# Patient Record
Sex: Female | Born: 1990 | Race: White | Hispanic: No | Marital: Married | Smoking: Never smoker
Health system: Southern US, Community
[De-identification: ages and names within clinical notes are randomized; demographics above are authoritative.]

## PROBLEM LIST (undated history)

## (undated) DIAGNOSIS — S3992XA Unspecified injury of lower back, initial encounter: Secondary | ICD-10-CM

## (undated) DIAGNOSIS — Z789 Other specified health status: Secondary | ICD-10-CM

## (undated) HISTORY — PX: ROOT CANAL: SHX2363

## (undated) HISTORY — PX: WISDOM TOOTH EXTRACTION: SHX21

---

## 2013-05-17 ENCOUNTER — Encounter: Payer: Self-pay | Admitting: Emergency Medicine

## 2013-05-17 ENCOUNTER — Emergency Department
Admission: EM | Admit: 2013-05-17 | Discharge: 2013-05-17 | Disposition: A | Discharge status: Home / Self Care | Payer: 59 | Source: Home / Self Care | Attending: Emergency Medicine | Admitting: Emergency Medicine

## 2013-05-17 DIAGNOSIS — M545 Low back pain, unspecified: Secondary | ICD-10-CM

## 2013-05-17 MED ORDER — CYCLOBENZAPRINE HCL 10 MG PO TABS: 10.0000 mg | ORAL_TABLET | Freq: Three times a day (TID) | ORAL | Status: DC | PRN

## 2013-05-17 MED ORDER — TRAMADOL HCL 50 MG PO TABS: 50.0000 mg | ORAL_TABLET | Freq: Four times a day (QID) | ORAL | Status: DC | PRN
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
## 2013-06-13 ENCOUNTER — Ambulatory Visit (INDEPENDENT_AMBULATORY_CARE_PROVIDER_SITE_OTHER): Payer: 59 | Admitting: Internal Medicine

## 2013-06-13 ENCOUNTER — Encounter: Payer: Self-pay | Admitting: Internal Medicine

## 2013-06-13 VITALS — BP 100/78 | HR 64 | Temp 98.9°F | Ht 68.0 in | Wt 137.0 lb

## 2013-06-13 DIAGNOSIS — Z13220 Encounter for screening for lipoid disorders: Secondary | ICD-10-CM

## 2013-06-13 DIAGNOSIS — N912 Amenorrhea, unspecified: Secondary | ICD-10-CM

## 2013-06-13 DIAGNOSIS — L700 Acne vulgaris: Secondary | ICD-10-CM

## 2013-06-13 DIAGNOSIS — L708 Other acne: Secondary | ICD-10-CM

## 2013-06-13 DIAGNOSIS — Z0000 Encounter for general adult medical examination without abnormal findings: Secondary | ICD-10-CM

## 2013-06-13 LAB — POCT URINALYSIS DIPSTICK
BILIRUBIN UA: NEGATIVE
GLUCOSE UA: NEGATIVE
Ketones, UA: NEGATIVE
LEUKOCYTES UA: NEGATIVE
Nitrite, UA: NEGATIVE
PH UA: 6
Protein, UA: NEGATIVE
RBC UA: NEGATIVE
Spec Grav, UA: 1.025
UROBILINOGEN UA: NEGATIVE

## 2013-06-13 LAB — CBC WITH DIFFERENTIAL/PLATELET
Basophils Absolute: 0.1 10*3/uL (ref 0.0–0.1)
Basophils Relative: 1 % (ref 0–1)
EOS ABS: 0.1 10*3/uL (ref 0.0–0.7)
EOS PCT: 2 % (ref 0–5)
HCT: 39.5 % (ref 36.0–46.0)
Hemoglobin: 13.1 g/dL (ref 12.0–15.0)
Lymphocytes Relative: 16 % (ref 12–46)
Lymphs Abs: 1.1 10*3/uL (ref 0.7–4.0)
MCH: 29.2 pg (ref 26.0–34.0)
MCHC: 33.2 g/dL (ref 30.0–36.0)
MCV: 88 fL (ref 78.0–100.0)
Monocytes Absolute: 1 10*3/uL (ref 0.1–1.0)
Monocytes Relative: 15 % — ABNORMAL HIGH (ref 3–12)
NEUTROS PCT: 66 % (ref 43–77)
Neutro Abs: 4.5 10*3/uL (ref 1.7–7.7)
PLATELETS: 281 10*3/uL (ref 150–400)
RBC: 4.49 MIL/uL (ref 3.87–5.11)
RDW: 13.9 % (ref 11.5–15.5)
WBC: 6.8 10*3/uL (ref 4.0–10.5)

## 2013-06-13 LAB — LIPID PANEL
CHOLESTEROL: 121 mg/dL (ref 0–200)
HDL: 67 mg/dL (ref >39)
LDL Cholesterol: 40 mg/dL (ref 0–99)
Total CHOL/HDL Ratio: 1.8 Ratio
Triglycerides: 69 mg/dL (ref <150)
VLDL: 14 mg/dL (ref 0–40)

## 2013-06-13 LAB — T4, FREE: Free T4: 0.95 ng/dL (ref 0.80–1.80)

## 2013-06-13 LAB — TSH: TSH: 2.051 u[IU]/mL (ref 0.350–4.500)
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
## 2013-06-13 NOTE — Addendum Note (Signed)
Addended by: Judy PimpleEILAND, Jevon Littlepage M on: 06/13/2013 12:38 PM   Modules accepted: Orders

## 2013-06-13 NOTE — Patient Instructions (Signed)
Please give us an updated copy of your immunizations. Return in one year or as needed. Hepatitis a number to do June 2015. You may need a third Gardasil immunization based on your history

## 2013-06-13 NOTE — Addendum Note (Signed)
Addended by: Judy PimpleEILAND, Melford Tullier M on: 06/13/2013 12:35 PM   Modules accepted: Orders

## 2013-11-25 ENCOUNTER — Emergency Department
Admission: EM | Admit: 2013-11-25 | Discharge: 2013-11-25 | Disposition: A | Discharge status: Home / Self Care | Payer: 59 | Source: Home / Self Care | Attending: Family Medicine | Admitting: Family Medicine

## 2013-11-25 ENCOUNTER — Encounter: Payer: Self-pay | Admitting: Emergency Medicine

## 2013-11-25 DIAGNOSIS — S39012A Strain of muscle, fascia and tendon of lower back, initial encounter: Secondary | ICD-10-CM

## 2013-11-25 DIAGNOSIS — S335XXA Sprain of ligaments of lumbar spine, initial encounter: Secondary | ICD-10-CM

## 2013-11-25 HISTORY — DX: Unspecified injury of lower back, initial encounter: S39.92XA

## 2013-11-25 MED ORDER — CYCLOBENZAPRINE HCL 10 MG PO TABS: ORAL_TABLET | ORAL | Status: DC

## 2013-11-25 MED ORDER — MELOXICAM 15 MG PO TABS
15.0000 mg | ORAL_TABLET | Freq: Every day | ORAL | Status: DC
Start: 2013-11-25 — End: 2014-06-15

## 2013-11-25 NOTE — Discharge Instructions (Signed)
Apply ice pack 3 to 4 times daily until pain decreases.  Begin back exercises in about 5 days.

## 2013-11-25 NOTE — ED Notes (Signed)
Reports picking up heavy book bag yesterday and straining muscles in lower right back; pain radiating into buttocks; needs note for missing work tonight; has taken ibuprofen with some relief.
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
## 2014-06-15 ENCOUNTER — Encounter: Payer: Self-pay | Admitting: Internal Medicine

## 2014-06-15 ENCOUNTER — Ambulatory Visit (INDEPENDENT_AMBULATORY_CARE_PROVIDER_SITE_OTHER): Payer: 59 | Admitting: Internal Medicine

## 2014-06-15 ENCOUNTER — Other Ambulatory Visit (HOSPITAL_COMMUNITY)
Admission: RE | Admit: 2014-06-15 | Discharge: 2014-06-15 | Disposition: A | Discharge status: Home / Self Care | Payer: 59 | Source: Ambulatory Visit | Attending: Internal Medicine | Admitting: Internal Medicine

## 2014-06-15 VITALS — BP 98/62 | HR 88 | Temp 98.2°F | Ht 68.0 in | Wt 139.0 lb

## 2014-06-15 DIAGNOSIS — L700 Acne vulgaris: Secondary | ICD-10-CM | POA: Diagnosis not present

## 2014-06-15 DIAGNOSIS — Z130 Encounter for screening for diseases of the blood and blood-forming organs and certain disorders involving the immune mechanism: Secondary | ICD-10-CM

## 2014-06-15 DIAGNOSIS — Z1321 Encounter for screening for nutritional disorder: Secondary | ICD-10-CM | POA: Diagnosis not present

## 2014-06-15 DIAGNOSIS — Z0000 Encounter for general adult medical examination without abnormal findings: Secondary | ICD-10-CM | POA: Diagnosis not present

## 2014-06-15 DIAGNOSIS — L739 Follicular disorder, unspecified: Secondary | ICD-10-CM

## 2014-06-15 DIAGNOSIS — Z13220 Encounter for screening for lipoid disorders: Secondary | ICD-10-CM | POA: Diagnosis not present

## 2014-06-15 DIAGNOSIS — Z1329 Encounter for screening for other suspected endocrine disorder: Secondary | ICD-10-CM | POA: Diagnosis not present

## 2014-06-15 DIAGNOSIS — L731 Pseudofolliculitis barbae: Secondary | ICD-10-CM

## 2014-06-15 DIAGNOSIS — Z01419 Encounter for gynecological examination (general) (routine) without abnormal findings: Secondary | ICD-10-CM | POA: Diagnosis present

## 2014-06-15 LAB — CBC WITH DIFFERENTIAL/PLATELET
BASOS ABS: 0.1 10*3/uL (ref 0.0–0.1)
BASOS PCT: 1 % (ref 0–1)
Eosinophils Absolute: 0.2 10*3/uL (ref 0.0–0.7)
Eosinophils Relative: 3 % (ref 0–5)
HCT: 42.6 % (ref 36.0–46.0)
HEMOGLOBIN: 14 g/dL (ref 12.0–15.0)
LYMPHS PCT: 18 % (ref 12–46)
Lymphs Abs: 1.5 10*3/uL (ref 0.7–4.0)
MCH: 29.3 pg (ref 26.0–34.0)
MCHC: 32.9 g/dL (ref 30.0–36.0)
MCV: 89.1 fL (ref 78.0–100.0)
MPV: 11 fL (ref 8.6–12.4)
Monocytes Absolute: 0.6 10*3/uL (ref 0.1–1.0)
Monocytes Relative: 7 % (ref 3–12)
NEUTROS ABS: 5.8 10*3/uL (ref 1.7–7.7)
Neutrophils Relative %: 71 % (ref 43–77)
PLATELETS: 312 10*3/uL (ref 150–400)
RBC: 4.78 MIL/uL (ref 3.87–5.11)
RDW: 13.9 % (ref 11.5–15.5)
WBC: 8.1 10*3/uL (ref 4.0–10.5)

## 2014-06-15 LAB — LIPID PANEL
Cholesterol: 142 mg/dL (ref 0–200)
HDL: 93 mg/dL (ref >=46)
LDL CALC: 36 mg/dL (ref 0–99)
TRIGLYCERIDES: 64 mg/dL (ref <150)
Total CHOL/HDL Ratio: 1.5 Ratio
VLDL: 13 mg/dL (ref 0–40)

## 2014-06-15 LAB — COMPLETE METABOLIC PANEL WITH GFR
ALK PHOS: 70 U/L (ref 39–117)
ALT: 64 U/L — AB (ref 0–35)
AST: 60 U/L — AB (ref 0–37)
Albumin: 4.7 g/dL (ref 3.5–5.2)
BILIRUBIN TOTAL: 0.7 mg/dL (ref 0.2–1.2)
BUN: 15 mg/dL (ref 6–23)
CALCIUM: 9.7 mg/dL (ref 8.4–10.5)
CO2: 30 mEq/L (ref 19–32)
Chloride: 105 mEq/L (ref 96–112)
Creat: 0.76 mg/dL (ref 0.50–1.10)
Glucose, Bld: 76 mg/dL (ref 70–99)
POTASSIUM: 5 meq/L (ref 3.5–5.3)
Sodium: 143 mEq/L (ref 135–145)
Total Protein: 7.4 g/dL (ref 6.0–8.3)

## 2014-06-15 LAB — POCT URINALYSIS DIPSTICK
Bilirubin, UA: NEGATIVE
Blood, UA: NEGATIVE
GLUCOSE UA: NEGATIVE
KETONES UA: NEGATIVE
Leukocytes, UA: NEGATIVE
NITRITE UA: NEGATIVE
PROTEIN UA: NEGATIVE
Spec Grav, UA: 1.02
UROBILINOGEN UA: NEGATIVE
pH, UA: 6

## 2014-06-15 LAB — TSH: TSH: 1.373 u[IU]/mL (ref 0.350–4.500)

## 2014-06-15 MED ORDER — CLINDAMYCIN PHOSPHATE 1 % EX LOTN: TOPICAL_LOTION | Freq: Two times a day (BID) | CUTANEOUS | Status: DC

## 2014-06-15 NOTE — Progress Notes (Signed)
   Subjective:    Patient ID: Denise Dawson, female    DOB: [birth year], 24 y.o.   MRN: [id number]  HPI 24 year old Female in today for health maintenance exam. Has had issues recently with rash on her genital area after a waxing. Also has developed acne on her back and face.   General health is good. Never been hospitalized overnight. Had an episode where her left foot Witten on in December 2013 for 3-1/2 weeks. She had an MRI of LS-spine that she says was  Normal.  No known drug allergies.  Takes Zyrtec for allergic rhinitis.  Social history: Does not smoke. Social alcohol consumption but not every weekend. She is single and never married. Lives with parents. 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 Moved here from [place] December 2014.  Family history: Father with history of kidney stones      Review of Systems  Skin:       Rash genital area. Acne on back       Objective:   Physical Exam  Constitutional: She is oriented to person, place, and time. She appears well-developed and well-nourished. No distress.  HENT:  Head: Normocephalic and atraumatic.  Right Ear: External ear normal.  Left Ear: External ear normal.  Mouth/Throat: Oropharynx is clear and moist. No oropharyngeal exudate.  Eyes: Conjunctivae and EOM are normal. Pupils are equal, round, and reactive to light. Right eye exhibits no discharge. Left eye exhibits no discharge. No scleral icterus.  Neck: Neck supple. No JVD present. No thyromegaly present.  Cardiovascular: Normal rate, regular rhythm and normal heart sounds.   No murmur heard. Pulmonary/Chest: Effort normal and breath sounds normal. No respiratory distress. She has no wheezes. She has no rales.  Abdominal: Soft. Bowel sounds are normal. She exhibits no distension and no mass. There is no tenderness. There is no rebound and no guarding.  Genitourinary:  Pap done. Bimanual normal.  Musculoskeletal: She exhibits no edema.    Neurological: She is alert and oriented to person, place, and time. She has normal reflexes. No cranial nerve deficit. Coordination normal.  Skin: Skin is warm and dry. No rash noted. She is not diaphoretic.  Folliculitis gentle area from waxing. Acne on back  Psychiatric: She has a normal mood and affect. Her behavior is normal. Judgment and thought content normal.  Vitals reviewed.         Assessment & Plan:  Folliculitis genital area secondary to waxing. Use 1% hydrocortisone cream after waxing  Acne vulgaris use Cleocin T on acne areas at bedtime  Plan:. Patient says that she's had 2 Gardasil immunizations but we do not have those records. She will need to have those sent to us from [place]. 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

## 2014-06-16 LAB — VITAMIN D 25 HYDROXY (VIT D DEFICIENCY, FRACTURES): Vit D, 25-Hydroxy: 33 ng/mL (ref 30–100)

## 2014-06-19 LAB — CYTOLOGY - PAP

## 2014-06-21 ENCOUNTER — Telehealth: Payer: Self-pay | Admitting: *Deleted

## 2014-06-21 MED ORDER — NORETHINDRONE ACET-ETHINYL EST 1-20 MG-MCG PO TABS: 1.0000 | ORAL_TABLET | Freq: Every day | ORAL | Status: DC

## 2014-06-21 NOTE — Telephone Encounter (Signed)
Patient called requesting her birth control pills (Junel )be called in to her pharmacy. [facility]

## 2014-07-06 ENCOUNTER — Encounter: Payer: Self-pay | Admitting: Internal Medicine

## 2014-07-06 DIAGNOSIS — L739 Follicular disorder, unspecified: Secondary | ICD-10-CM | POA: Insufficient documentation

## 2014-07-06 NOTE — Patient Instructions (Signed)
Use Cleocin T on acne areas at bedtime. Use 1% hydrocortisone cream after shaving or waxing.

## 2014-07-11 ENCOUNTER — Other Ambulatory Visit: Payer: Self-pay | Admitting: *Deleted

## 2014-07-11 MED ORDER — NORETHIN ACE-ETH ESTRAD-FE 1.5-30 MG-MCG PO TABS: 1.0000 | ORAL_TABLET | Freq: Every day | ORAL | Status: DC

## 2014-07-11 NOTE — Telephone Encounter (Signed)
Refilled patient Birth Control pills

## 2014-08-08 ENCOUNTER — Ambulatory Visit (INDEPENDENT_AMBULATORY_CARE_PROVIDER_SITE_OTHER): Payer: 59 | Admitting: Internal Medicine

## 2014-08-08 ENCOUNTER — Encounter: Payer: Self-pay | Admitting: Internal Medicine

## 2014-08-08 VITALS — BP 110/70 | HR 68 | Temp 97.6°F | Wt 140.0 lb

## 2014-08-08 DIAGNOSIS — N939 Abnormal uterine and vaginal bleeding, unspecified: Secondary | ICD-10-CM | POA: Diagnosis not present

## 2014-08-08 DIAGNOSIS — N898 Other specified noninflammatory disorders of vagina: Secondary | ICD-10-CM

## 2014-08-08 DIAGNOSIS — T192XXA Foreign body in vulva and vagina, initial encounter: Secondary | ICD-10-CM | POA: Diagnosis not present

## 2014-08-08 DIAGNOSIS — N926 Irregular menstruation, unspecified: Secondary | ICD-10-CM | POA: Diagnosis not present

## 2014-08-08 DIAGNOSIS — W448XXA Other foreign body entering into or through a natural orifice, initial encounter: Secondary | ICD-10-CM

## 2014-08-08 DIAGNOSIS — R350 Frequency of micturition: Secondary | ICD-10-CM | POA: Diagnosis not present

## 2014-08-08 LAB — POCT URINALYSIS DIPSTICK
Bilirubin, UA: NEGATIVE
GLUCOSE UA: NEGATIVE
Ketones, UA: NEGATIVE
LEUKOCYTES UA: NEGATIVE
Nitrite, UA: NEGATIVE
Spec Grav, UA: 1.015
Urobilinogen, UA: NEGATIVE
pH, UA: 6

## 2014-08-08 NOTE — Progress Notes (Signed)
   Subjective:    Patient ID: Denise Dawson, female    DOB: [birth year], 24 y.o.   MRN: [id number]  HPI [facility] complaining of foul-smelling urine. Had abnormal urinalysis. Culture was taken and had no significant growth. She was treated with Macrobid. She is complaining of foul smelling vaginal discharge. Doesn't understand why she has that. Says it's been going on for at least 1-1/2 weeks and she is no better. Visit to [facility] was April 26. Patient recently noticed some vaginal bleeding. She just got back on oral contraceptives. Has had unprotected intercourse recently with someone she's been seeing for a bit.    Review of Systems     Objective:   Physical Exam  There is a foreign object in the vagina that was removed with ring forceps and is a tampon. She has brown foul-smelling blood in the vaginal vault. GC and chlamydia probes taken. Did not do wet prep. Serum pregnancy test done.      Assessment & Plan:  Retained tampon in vagina  Suspect she is having irregular menstrual period and has not been back on oral contraceptives long enough to get stabilized but rule out pregnancy  Check for STD  Plan: Serum hCG drawn. GC and chlamydia probes taken. Is to continue with oral contraceptives and call if abnormal vaginal bleeding persist. Use a vinegar and water douche once.

## 2014-08-08 NOTE — Patient Instructions (Signed)
Tampon removed. GC and chlamydia probes taken because of unprotected intercourse. Serum pregnancy test done. Use vinegar and water douche. Call if symptoms do not improve

## 2014-08-09 LAB — GC/CHLAMYDIA PROBE AMP
CT Probe RNA: NEGATIVE
GC Probe RNA: NEGATIVE

## 2014-08-09 LAB — HCG, SERUM, QUALITATIVE: Preg, Serum: NEGATIVE

## 2014-08-10 ENCOUNTER — Telehealth: Payer: Self-pay | Admitting: *Deleted

## 2014-08-10 NOTE — Telephone Encounter (Signed)
Left message regarding lab results on patient voice mail.

## 2015-03-06 ENCOUNTER — Encounter: Payer: Self-pay | Admitting: Internal Medicine

## 2015-03-06 ENCOUNTER — Ambulatory Visit (INDEPENDENT_AMBULATORY_CARE_PROVIDER_SITE_OTHER): Payer: 59 | Admitting: Internal Medicine

## 2015-03-06 VITALS — BP 114/76 | HR 74 | Temp 98.3°F | Resp 20 | Ht 68.0 in | Wt 149.0 lb

## 2015-03-06 DIAGNOSIS — R1032 Left lower quadrant pain: Secondary | ICD-10-CM

## 2015-03-06 DIAGNOSIS — Z7251 High risk heterosexual behavior: Secondary | ICD-10-CM

## 2015-03-06 LAB — CBC WITH DIFFERENTIAL/PLATELET
Basophils Absolute: 0.1 10*3/uL (ref 0.0–0.1)
Basophils Relative: 1 % (ref 0–1)
EOS ABS: 0.3 10*3/uL (ref 0.0–0.7)
Eosinophils Relative: 3 % (ref 0–5)
HCT: 40.2 % (ref 36.0–46.0)
Hemoglobin: 13.8 g/dL (ref 12.0–15.0)
LYMPHS PCT: 20 % (ref 12–46)
Lymphs Abs: 1.8 10*3/uL (ref 0.7–4.0)
MCH: 30.6 pg (ref 26.0–34.0)
MCHC: 34.3 g/dL (ref 30.0–36.0)
MCV: 89.1 fL (ref 78.0–100.0)
MONO ABS: 0.7 10*3/uL (ref 0.1–1.0)
MONOS PCT: 8 % (ref 3–12)
MPV: 11.5 fL (ref 8.6–12.4)
Neutro Abs: 6.2 10*3/uL (ref 1.7–7.7)
Neutrophils Relative %: 68 % (ref 43–77)
PLATELETS: 336 10*3/uL (ref 150–400)
RBC: 4.51 MIL/uL (ref 3.87–5.11)
RDW: 13.2 % (ref 11.5–15.5)
WBC: 9.1 10*3/uL (ref 4.0–10.5)

## 2015-03-06 LAB — POCT URINALYSIS DIPSTICK
BILIRUBIN UA: NEGATIVE
GLUCOSE UA: NEGATIVE
KETONES UA: NEGATIVE
Leukocytes, UA: NEGATIVE
NITRITE UA: NEGATIVE
PH UA: 7
Protein, UA: NEGATIVE
RBC UA: NEGATIVE
SPEC GRAV UA: 1.01
Urobilinogen, UA: 0.2

## 2015-03-07 LAB — HIV ANTIBODY (ROUTINE TESTING W REFLEX): HIV: NONREACTIVE

## 2015-03-07 LAB — HCG, QUANTITATIVE, PREGNANCY: hCG, Beta Chain, Quant, S: <2 m[IU]/mL

## 2015-03-07 LAB — GC/CHLAMYDIA PROBE AMP
CT PROBE, AMP APTIMA: NEGATIVE
GC PROBE AMP APTIMA: NEGATIVE

## 2015-03-07 LAB — HEPATITIS C ANTIBODY: HCV Ab: NEGATIVE

## 2015-03-07 NOTE — Progress Notes (Signed)
   Subjective:    Patient ID: Denise Dawson, female    DOB: 1990-10-02, 24 y.o.   MRN: [id number]  HPI  Patient has been having left lower quadrant abdominal "pressure " for the past couple of weeks. No dysuria. No back pain. No history of heavy lifting or exertion.  No vaginal discharge. Patient has been sexually active without use of protection recently. She  has been prescribed  Oral contraceptives in the past but currently is not taking oral contraceptives. Did not use condoms with sexual intercourse recently. Has some concerns about a possible retained tampon since she had this happen one other occasion. No vaginal odor that she has noticed. Says she took a home pregnancy test which was negative.    Review of Systems     Objective:   Physical Exam  Abdomen is soft nondistended without hepatosplenomegaly 4 masses. Very mildly tender in left lower quadrant without rebound tenderness and not much tenderness in all. Urinalysis is normal. She has a thick white vaginal discharge. GC and chlamydia probes taken.       Assessment & Plan:   Left lower quadrant abdominal pain consider PID, ectopic pregnancy, musculoskeletal pain, kidney stone. Urinalysis is normal and shows no evidence of hematuria or infection. GC and chlamydia probes are pending. She'll have ultrasound of left abdomen to see if she has a left ovarian cyst or possible tubo-ovarian abscess. CBC drawn. Serum hCG drawn. Hepatitis C antibody and HIV drawn.   25 minutes spent with patient.

## 2015-03-07 NOTE — Patient Instructions (Signed)
Lab work is pending. GC and chlamydia probes pending. Serum pregnancy test pending. Have ultrasound of left lower abdomen to look for ovarian cyst.

## 2015-03-14 ENCOUNTER — Other Ambulatory Visit: Payer: Self-pay | Admitting: Internal Medicine

## 2015-03-14 ENCOUNTER — Telehealth: Payer: Self-pay

## 2015-03-14 DIAGNOSIS — R1032 Left lower quadrant pain: Secondary | ICD-10-CM

## 2015-03-15 NOTE — Telephone Encounter (Signed)
Order for US was incorrect new orders placed for u/s abd and pelvis

## 2015-03-19 ENCOUNTER — Ambulatory Visit
Admission: RE | Admit: 2015-03-19 | Discharge: 2015-03-19 | Disposition: A | Discharge status: Home / Self Care | Payer: 59 | Source: Ambulatory Visit | Attending: Internal Medicine | Admitting: Internal Medicine

## 2015-03-19 DIAGNOSIS — R1032 Left lower quadrant pain: Secondary | ICD-10-CM

## 2015-04-16 ENCOUNTER — Encounter: Payer: Self-pay | Admitting: Emergency Medicine

## 2015-04-16 ENCOUNTER — Emergency Department
Admission: EM | Admit: 2015-04-16 | Discharge: 2015-04-16 | Disposition: A | Discharge status: Home / Self Care | Payer: 59 | Source: Home / Self Care | Attending: Family Medicine | Admitting: Family Medicine

## 2015-04-16 DIAGNOSIS — N390 Urinary tract infection, site not specified: Secondary | ICD-10-CM

## 2015-04-16 LAB — POCT URINALYSIS DIP (MANUAL ENTRY)
Bilirubin, UA: NEGATIVE
Blood, UA: NEGATIVE
Glucose, UA: NEGATIVE
Ketones, POC UA: NEGATIVE
Nitrite, UA: NEGATIVE
Protein Ur, POC: NEGATIVE
Spec Grav, UA: 1.015 (ref 1.005–1.03)
Urobilinogen, UA: 0.2 (ref 0–1)
pH, UA: 7 (ref 5–8)

## 2015-04-16 MED ORDER — CEPHALEXIN 500 MG PO CAPS: 500.0000 mg | ORAL_CAPSULE | Freq: Two times a day (BID) | ORAL | Status: DC

## 2015-04-16 NOTE — Discharge Instructions (Signed)
You may try an over the counter medication called phenazopyridine (e.g. Azo, Baridium or pyridium) for urinary discomfort and bladder spasms.  The medication does turn your urine orange, this is normal.  Do not take more than is indicated on the packaging.    Please take antibiotics as prescribed and be sure to complete entire course even if you start to feel better to ensure infection does not come back.
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
## 2015-04-16 NOTE — ED Notes (Signed)
Patient reports 6 days of dysuria; used OTC/AZO until last evening for comfort.

## 2015-04-18 ENCOUNTER — Telehealth: Payer: Self-pay | Admitting: *Deleted

## 2015-04-18 LAB — URINE CULTURE
Colony Count: NO GROWTH
Organism ID, Bacteria: NO GROWTH

## 2019-01-12 LAB — OB RESULTS CONSOLE ABO/RH: RH Type: POSITIVE

## 2019-01-12 LAB — OB RESULTS CONSOLE GC/CHLAMYDIA
Chlamydia: NEGATIVE
Gonorrhea: NEGATIVE

## 2019-01-12 LAB — OB RESULTS CONSOLE ANTIBODY SCREEN: Antibody Screen: NEGATIVE

## 2019-01-12 LAB — OB RESULTS CONSOLE HEPATITIS B SURFACE ANTIGEN: Hepatitis B Surface Ag: NEGATIVE

## 2019-01-12 LAB — OB RESULTS CONSOLE RPR: RPR: NONREACTIVE

## 2019-01-12 LAB — OB RESULTS CONSOLE HIV ANTIBODY (ROUTINE TESTING): HIV: NONREACTIVE

## 2019-01-12 LAB — OB RESULTS CONSOLE RUBELLA ANTIBODY, IGM: Rubella: IMMUNE

## 2019-07-06 ENCOUNTER — Encounter (HOSPITAL_COMMUNITY): Payer: Self-pay

## 2019-07-08 ENCOUNTER — Encounter (HOSPITAL_COMMUNITY): Payer: Self-pay

## 2019-07-08 NOTE — Patient Instructions (Addendum)
Tarrie Mcmichen  07/08/2019   Your procedure is scheduled on:  07/18/2019  Arrive at 1030 at Entrance C on [employer] at [facility]  and [employer]. You are invited to use the FREE valet parking or use the Visitor's parking deck.  Pick up the phone at the desk and dial [phone].  Call this number if you have problems the morning of surgery: [phone]  Remember:   Do not eat food:(After Midnight) Desps de medianoche.  Do not drink clear liquids: (After Midnight) Desps de medianoche.  Take these medicines the morning of surgery with A SIP OF WATER:  none   Do not wear jewelry, make-up or nail polish.  Do not wear lotions, powders, or perfumes. Do not wear deodorant.  Do not shave 48 hours prior to surgery.  Do not bring valuables to the hospital.  [facility] is not   responsible for any belongings or valuables brought to the hospital.  Contacts, dentures or bridgework may not be worn into surgery.  Leave suitcase in the car. After surgery it may be brought to your room.  For patients admitted to the hospital, checkout time is 11:00 AM the day of              discharge.      Please read over the following fact sheets that you were given:     Preparing for Surgery

## 2019-07-09 ENCOUNTER — Inpatient Hospital Stay (HOSPITAL_COMMUNITY)
Admission: AD | Admit: 2019-07-09 | Discharge: 2019-07-12 | DRG: 788 | Disposition: A | Discharge status: Home / Self Care | Payer: Commercial Managed Care - PPO | Attending: Obstetrics | Admitting: Obstetrics

## 2019-07-09 ENCOUNTER — Encounter (HOSPITAL_COMMUNITY): Payer: Self-pay | Admitting: Obstetrics and Gynecology

## 2019-07-09 ENCOUNTER — Other Ambulatory Visit: Payer: Self-pay

## 2019-07-09 DIAGNOSIS — Z880 Allergy status to penicillin: Secondary | ICD-10-CM

## 2019-07-09 DIAGNOSIS — O42013 Preterm premature rupture of membranes, onset of labor within 24 hours of rupture, third trimester: Secondary | ICD-10-CM | POA: Diagnosis not present

## 2019-07-09 DIAGNOSIS — Z20822 Contact with and (suspected) exposure to covid-19: Secondary | ICD-10-CM | POA: Diagnosis present

## 2019-07-09 DIAGNOSIS — O321XX1 Maternal care for breech presentation, fetus 1: Secondary | ICD-10-CM | POA: Diagnosis present

## 2019-07-09 DIAGNOSIS — Z3A38 38 weeks gestation of pregnancy: Secondary | ICD-10-CM | POA: Diagnosis not present

## 2019-07-09 DIAGNOSIS — O321XX0 Maternal care for breech presentation, not applicable or unspecified: Principal | ICD-10-CM | POA: Diagnosis present

## 2019-07-09 LAB — CBC
HCT: 43.5 % (ref 36.0–46.0)
Hemoglobin: 14.1 g/dL (ref 12.0–15.0)
MCH: 29.9 pg (ref 26.0–34.0)
MCHC: 32.4 g/dL (ref 30.0–36.0)
MCV: 92.4 fL (ref 80.0–100.0)
Platelets: 221 10*3/uL (ref 150–400)
RBC: 4.71 MIL/uL (ref 3.87–5.11)
RDW: 13 % (ref 11.5–15.5)
WBC: 17.2 10*3/uL — ABNORMAL HIGH (ref 4.0–10.5)
nRBC: 0 % (ref 0.0–0.2)

## 2019-07-09 LAB — RESPIRATORY PANEL BY RT PCR (FLU A&B, COVID)
Influenza A by PCR: NEGATIVE
Influenza B by PCR: NEGATIVE
SARS Coronavirus 2 by RT PCR: NEGATIVE

## 2019-07-09 LAB — TYPE AND SCREEN
ABO/RH(D): O POS
Antibody Screen: NEGATIVE

## 2019-07-09 LAB — POCT FERN TEST: POCT Fern Test: POSITIVE — AB

## 2019-07-09 MED ORDER — PROMETHAZINE HCL 25 MG/ML IJ SOLN
25.0000 mg | Freq: Once | INTRAMUSCULAR | Status: AC
  Administered 2019-07-09: 25 mg via INTRAVENOUS
  Filled 2019-07-09: qty 1

## 2019-07-09 MED ORDER — FAMOTIDINE IN NACL 20-0.9 MG/50ML-% IV SOLN
INTRAVENOUS | Status: AC
  Filled 2019-07-09: qty 50

## 2019-07-09 MED ORDER — FAMOTIDINE IN NACL 20-0.9 MG/50ML-% IV SOLN
20.0000 mg | Freq: Once | INTRAVENOUS | Status: AC
  Administered 2019-07-09: 20 mg via INTRAVENOUS
  Filled 2019-07-09: qty 50

## 2019-07-09 MED ORDER — CALCIUM CARBONATE ANTACID 500 MG PO CHEW: 2.0000 | CHEWABLE_TABLET | ORAL | Status: DC | PRN

## 2019-07-09 MED ORDER — PRENATAL MULTIVITAMIN CH: 1.0000 | ORAL_TABLET | Freq: Every day | ORAL | Status: DC

## 2019-07-09 MED ORDER — ZOLPIDEM TARTRATE 5 MG PO TABS: 5.0000 mg | ORAL_TABLET | Freq: Every evening | ORAL | Status: DC | PRN

## 2019-07-09 MED ORDER — DOCUSATE SODIUM 100 MG PO CAPS: 100.0000 mg | ORAL_CAPSULE | Freq: Every day | ORAL | Status: DC

## 2019-07-09 MED ORDER — ACETAMINOPHEN 325 MG PO TABS: 650.0000 mg | ORAL_TABLET | ORAL | Status: DC | PRN

## 2019-07-09 MED ORDER — CEFAZOLIN SODIUM-DEXTROSE 2-4 GM/100ML-% IV SOLN
2.0000 g | INTRAVENOUS | Status: AC
  Administered 2019-07-10: 2 g via INTRAVENOUS
  Filled 2019-07-09: qty 100

## 2019-07-09 NOTE — H&P (Signed)
29 y.o.  G1P0 [redacted]w[redacted]d comes in ROM with breech for cesarean section at term.  Patient has good fetal movement and no bleeding. ROM about 5 pm, clear.  Past Medical History:  Diagnosis Date  . Medical history non-contributory     Past Surgical History:  Procedure Laterality Date  . WISDOM TOOTH EXTRACTION      OB History  Gravida Para Term Preterm AB Living  1            SAB TAB Ectopic Multiple Live Births               # Outcome Date GA Lbr Len/2nd Weight Sex Delivery Anes PTL Lv  1 Current             Social History   Socioeconomic History  . Marital status: Unknown    Spouse name: Not on file  . Number of children: Not on file  . Years of education: Not on file  . Highest education level: Not on file  Occupational History  . Not on file  Tobacco Use  . Smoking status: Never Smoker  . Smokeless tobacco: Never Used  Substance and Sexual Activity  . Alcohol use: Not Currently  . Drug use: Never  . Sexual activity: Not on file  Other Topics Concern  . Not on file  Social History Narrative  . Not on file   Social Determinants of Health   Financial Resource Strain:   . Difficulty of Paying Living Expenses:   Food Insecurity:   . Worried About Programme researcher, broadcasting/film/video in the Last Year:   . Barista in the Last Year:   Transportation Needs:   . Freight forwarder (Medical):   Marland Kitchen Lack of Transportation (Non-Medical):   Physical Activity:   . Days of Exercise per Week:   . Minutes of Exercise per Session:   Stress:   . Feeling of Stress :   Social Connections:   . Frequency of Communication with Friends and Family:   . Frequency of Social Gatherings with Friends and Family:   . Attends Religious Services:   . Active Member of Clubs or Organizations:   . Attends Banker Meetings:   Marland Kitchen Marital Status:   Intimate Partner Violence:   . Fear of Current or Ex-Partner:   . Emotionally Abused:   Marland Kitchen Physically Abused:   . Sexually Abused:     Penicillins   Prenatal Course: Breech presentation confirmed in MAU.   Prenatal Transfer Tool  Maternal Diabetes: No Genetic Screening: Normal Maternal Ultrasounds/Referrals: Normal; low fluid over last week. Fetal Ultrasounds or other Referrals:  None Maternal Substance Abuse:  No Significant Maternal Medications:  None Significant Maternal Lab Results: Group B Strep negative  Vitals:   07/09/19 1914  BP: 123/84  Pulse: 94  Resp: 17  Temp: 98.8 F (37.1 C)  Weight: 81.8 kg    Lungs/Cor:  NAD Abdomen:  soft, gravid Ex:  no cords, erythema SVE:  NA FHTs:  140s, gstv, NSTR, cat 1  A/P   For cesarean sectionat term at 0600 (pt cannot go before 2 am unless labor or fetal issues).  All risks, benefits and alternatives discussed with patient and she desires to proceed.  PCN allergy is nausea only.  Fetal monitoring for now.  Clears until 10 pm.  GBS neg.  Loney Laurence

## 2019-07-09 NOTE — MAU Note (Signed)
States she noticed she was leaking clear fluid since being at [employer] earlier today.  Denies VB.  No contractions. Reports her baby is breech and has oligo so wanted to come get checked.  Endorses + FM.

## 2019-07-09 NOTE — MAU Provider Note (Signed)
First Provider Initiated Contact with Patient 07/09/19 1950     S: Ms. Denise Dawson is a 29 y.o. G1P0 at [redacted]w[redacted]d  who presents to MAU today complaining of leaking of fluid since early this evening. She denies vaginal bleeding. She denies contractions. She reports normal fetal movement.    Patient states she has been persistently breech in clinic and is planning a primary cesarean. Patient most recently ate a [employer] hotdog at 1800 hours.  She receives care with [facility].  O: BP 123/84    Pulse 94    Temp 98.8 F (37.1 C)    Resp 17    Wt 81.8 kg    LMP 10/15/2018    BMI 27.43 kg/m  GENERAL: Well-developed, well-nourished female in no acute distress.  HEAD: Normocephalic, atraumatic.  CHEST: Normal effort of breathing, regular heart rate ABDOMEN: Soft, nontender, gravid PELVIC: Normal external female genitalia. Vagina is pink and rugated. Cervix with normal contour, no lesions. Normal discharge.  Positive pooling. Grossly ruptured with passage of large volumes of fluid on sterile speculum insertion  Cervical exam: Deferred due to frank breech position    Fetal Monitoring: Baseline: 135 Variability: Mod Accelerations: Pos 15 x 15 Decelerations: N/A Contractions: Occasional  A: SIUP at [redacted]w[redacted]d  Grossly ruptured Fern positive Confirmed frank breech consistent with previous scan  P: Prep for OR  Clayton Bibles, CNM 07/09/2019 9:38 PM

## 2019-07-10 ENCOUNTER — Encounter (HOSPITAL_COMMUNITY)
Admission: AD | Disposition: A | Discharge status: Home / Self Care | Payer: Self-pay | Source: Home / Self Care | Attending: Obstetrics

## 2019-07-10 ENCOUNTER — Encounter (HOSPITAL_COMMUNITY): Payer: Self-pay | Admitting: Obstetrics and Gynecology

## 2019-07-10 ENCOUNTER — Inpatient Hospital Stay (HOSPITAL_COMMUNITY): Payer: Commercial Managed Care - PPO | Admitting: Anesthesiology

## 2019-07-10 DIAGNOSIS — O321XX0 Maternal care for breech presentation, not applicable or unspecified: Secondary | ICD-10-CM | POA: Diagnosis present

## 2019-07-10 LAB — RPR: RPR Ser Ql: NONREACTIVE

## 2019-07-10 LAB — ABO/RH: ABO/RH(D): O POS

## 2019-07-10 SURGERY — Surgical Case
Anesthesia: Spinal

## 2019-07-10 MED ORDER — OXYTOCIN 40 UNITS IN NORMAL SALINE INFUSION - SIMPLE MED
INTRAVENOUS | Status: AC
  Filled 2019-07-10: qty 1000

## 2019-07-10 MED ORDER — PHENYLEPHRINE HCL (PRESSORS) 10 MG/ML IV SOLN
INTRAVENOUS | Status: DC | PRN
  Administered 2019-07-10 (×2): 80 ug via INTRAVENOUS

## 2019-07-10 MED ORDER — KETOROLAC TROMETHAMINE 30 MG/ML IJ SOLN: 30.0000 mg | Freq: Once | INTRAMUSCULAR | Status: DC | PRN

## 2019-07-10 MED ORDER — MEASLES, MUMPS & RUBELLA VAC IJ SOLR: 0.5000 mL | Freq: Once | INTRAMUSCULAR | Status: DC

## 2019-07-10 MED ORDER — SCOPOLAMINE 1 MG/3DAYS TD PT72
MEDICATED_PATCH | TRANSDERMAL | Status: AC
  Filled 2019-07-10: qty 1

## 2019-07-10 MED ORDER — NALOXONE HCL 0.4 MG/ML IJ SOLN: 0.4000 mg | INTRAMUSCULAR | Status: DC | PRN

## 2019-07-10 MED ORDER — LACTATED RINGERS IV SOLN: INTRAVENOUS | Status: DC

## 2019-07-10 MED ORDER — FERROUS SULFATE 325 (65 FE) MG PO TABS
325.0000 mg | ORAL_TABLET | Freq: Two times a day (BID) | ORAL | Status: DC
  Administered 2019-07-10 – 2019-07-12 (×4): 325 mg via ORAL
  Filled 2019-07-10 (×4): qty 1

## 2019-07-10 MED ORDER — METHYLERGONOVINE MALEATE 0.2 MG/ML IJ SOLN: 0.2000 mg | INTRAMUSCULAR | Status: DC | PRN

## 2019-07-10 MED ORDER — ACETAMINOPHEN 10 MG/ML IV SOLN: 1000.0000 mg | Freq: Once | INTRAVENOUS | Status: DC | PRN

## 2019-07-10 MED ORDER — SIMETHICONE 80 MG PO CHEW: 80.0000 mg | CHEWABLE_TABLET | ORAL | Status: DC | PRN

## 2019-07-10 MED ORDER — FLEET ENEMA 7-19 GM/118ML RE ENEM: 1.0000 | ENEMA | Freq: Every day | RECTAL | Status: DC | PRN

## 2019-07-10 MED ORDER — DIPHENHYDRAMINE HCL 25 MG PO CAPS: 25.0000 mg | ORAL_CAPSULE | ORAL | Status: DC | PRN

## 2019-07-10 MED ORDER — ONDANSETRON HCL 4 MG/2ML IJ SOLN
INTRAMUSCULAR | Status: AC
  Filled 2019-07-10: qty 2

## 2019-07-10 MED ORDER — ZOLPIDEM TARTRATE 5 MG PO TABS: 5.0000 mg | ORAL_TABLET | Freq: Every evening | ORAL | Status: DC | PRN

## 2019-07-10 MED ORDER — KETOROLAC TROMETHAMINE 30 MG/ML IJ SOLN: 30.0000 mg | Freq: Four times a day (QID) | INTRAMUSCULAR | Status: AC | PRN

## 2019-07-10 MED ORDER — SODIUM CHLORIDE 0.9 % IV SOLN: INTRAVENOUS | Status: DC | PRN

## 2019-07-10 MED ORDER — SOD CITRATE-CITRIC ACID 500-334 MG/5ML PO SOLN
ORAL | Status: AC
  Administered 2019-07-10: 30 mL
  Filled 2019-07-10: qty 30

## 2019-07-10 MED ORDER — KETOROLAC TROMETHAMINE 30 MG/ML IJ SOLN: 30.0000 mg | Freq: Once | INTRAMUSCULAR | Status: DC

## 2019-07-10 MED ORDER — DIPHENHYDRAMINE HCL 25 MG PO CAPS: 25.0000 mg | ORAL_CAPSULE | Freq: Four times a day (QID) | ORAL | Status: DC | PRN

## 2019-07-10 MED ORDER — FENTANYL CITRATE (PF) 100 MCG/2ML IJ SOLN
INTRAMUSCULAR | Status: AC
  Filled 2019-07-10: qty 2

## 2019-07-10 MED ORDER — NALOXONE HCL 4 MG/10ML IJ SOLN
1.0000 ug/kg/h | INTRAVENOUS | Status: DC | PRN
  Filled 2019-07-10: qty 5

## 2019-07-10 MED ORDER — ACETAMINOPHEN 500 MG PO TABS: 1000.0000 mg | ORAL_TABLET | Freq: Four times a day (QID) | ORAL | Status: AC

## 2019-07-10 MED ORDER — DIBUCAINE (PERIANAL) 1 % EX OINT: 1.0000 "application " | TOPICAL_OINTMENT | CUTANEOUS | Status: DC | PRN

## 2019-07-10 MED ORDER — KETOROLAC TROMETHAMINE 30 MG/ML IJ SOLN
30.0000 mg | Freq: Four times a day (QID) | INTRAMUSCULAR | Status: AC | PRN
  Administered 2019-07-10 (×2): 30 mg via INTRAVENOUS
  Filled 2019-07-10 (×2): qty 1

## 2019-07-10 MED ORDER — NALBUPHINE HCL 10 MG/ML IJ SOLN: 5.0000 mg | Freq: Once | INTRAMUSCULAR | Status: DC | PRN

## 2019-07-10 MED ORDER — MENTHOL 3 MG MT LOZG
1.0000 | LOZENGE | OROMUCOSAL | Status: DC | PRN
  Administered 2019-07-11: 3 mg via ORAL
  Filled 2019-07-10: qty 9

## 2019-07-10 MED ORDER — LACTATED RINGERS IV SOLN: INTRAVENOUS | Status: DC | PRN

## 2019-07-10 MED ORDER — PHENYLEPHRINE HCL-NACL 20-0.9 MG/250ML-% IV SOLN
INTRAVENOUS | Status: DC | PRN
  Administered 2019-07-10: 60 ug/min via INTRAVENOUS

## 2019-07-10 MED ORDER — PHENYLEPHRINE HCL-NACL 20-0.9 MG/250ML-% IV SOLN
INTRAVENOUS | Status: AC
  Filled 2019-07-10: qty 250

## 2019-07-10 MED ORDER — SENNOSIDES-DOCUSATE SODIUM 8.6-50 MG PO TABS
2.0000 | ORAL_TABLET | ORAL | Status: DC
  Administered 2019-07-11 – 2019-07-12 (×2): 2 via ORAL
  Filled 2019-07-10 (×2): qty 2

## 2019-07-10 MED ORDER — DIPHENHYDRAMINE HCL 50 MG/ML IJ SOLN: 12.5000 mg | INTRAMUSCULAR | Status: DC | PRN

## 2019-07-10 MED ORDER — OXYTOCIN 10 UNIT/ML IJ SOLN
INTRAMUSCULAR | Status: DC | PRN
  Administered 2019-07-10: 40 [IU] via INTRAMUSCULAR

## 2019-07-10 MED ORDER — SIMETHICONE 80 MG PO CHEW
80.0000 mg | CHEWABLE_TABLET | ORAL | Status: DC
  Administered 2019-07-11 – 2019-07-12 (×2): 80 mg via ORAL
  Filled 2019-07-10 (×2): qty 1

## 2019-07-10 MED ORDER — PHENYLEPHRINE 40 MCG/ML (10ML) SYRINGE FOR IV PUSH (FOR BLOOD PRESSURE SUPPORT)
PREFILLED_SYRINGE | INTRAVENOUS | Status: AC
  Filled 2019-07-10: qty 10

## 2019-07-10 MED ORDER — OXYTOCIN 40 UNITS IN NORMAL SALINE INFUSION - SIMPLE MED: 2.5000 [IU]/h | INTRAVENOUS | Status: AC

## 2019-07-10 MED ORDER — IBUPROFEN 600 MG PO TABS: 600.0000 mg | ORAL_TABLET | Freq: Four times a day (QID) | ORAL | Status: DC | PRN

## 2019-07-10 MED ORDER — METHYLERGONOVINE MALEATE 0.2 MG PO TABS: 0.2000 mg | ORAL_TABLET | ORAL | Status: DC | PRN

## 2019-07-10 MED ORDER — IBUPROFEN 800 MG PO TABS
800.0000 mg | ORAL_TABLET | Freq: Three times a day (TID) | ORAL | Status: DC
  Administered 2019-07-11 – 2019-07-12 (×6): 800 mg via ORAL
  Filled 2019-07-10 (×6): qty 1

## 2019-07-10 MED ORDER — TETANUS-DIPHTH-ACELL PERTUSSIS 5-2.5-18.5 LF-MCG/0.5 IM SUSP: 0.5000 mL | Freq: Once | INTRAMUSCULAR | Status: DC

## 2019-07-10 MED ORDER — FENTANYL CITRATE (PF) 100 MCG/2ML IJ SOLN: 25.0000 ug | INTRAMUSCULAR | Status: DC | PRN

## 2019-07-10 MED ORDER — WITCH HAZEL-GLYCERIN EX PADS: 1.0000 "application " | MEDICATED_PAD | CUTANEOUS | Status: DC | PRN

## 2019-07-10 MED ORDER — ACETAMINOPHEN 10 MG/ML IV SOLN
INTRAVENOUS | Status: AC
  Filled 2019-07-10: qty 100

## 2019-07-10 MED ORDER — MORPHINE SULFATE (PF) 0.5 MG/ML IJ SOLN
INTRAMUSCULAR | Status: DC | PRN
  Administered 2019-07-10: .15 mg via INTRATHECAL

## 2019-07-10 MED ORDER — OXYCODONE-ACETAMINOPHEN 5-325 MG PO TABS
1.0000 | ORAL_TABLET | ORAL | Status: DC | PRN
  Administered 2019-07-10: 1 via ORAL
  Administered 2019-07-10: 2 via ORAL
  Administered 2019-07-11 (×2): 1 via ORAL
  Filled 2019-07-10 (×2): qty 1
  Filled 2019-07-10: qty 2
  Filled 2019-07-10: qty 1

## 2019-07-10 MED ORDER — STERILE WATER FOR IRRIGATION IR SOLN
Status: DC | PRN
  Administered 2019-07-10: 1000 mL

## 2019-07-10 MED ORDER — NALBUPHINE HCL 10 MG/ML IJ SOLN: 5.0000 mg | INTRAMUSCULAR | Status: DC | PRN

## 2019-07-10 MED ORDER — PRENATAL MULTIVITAMIN CH
1.0000 | ORAL_TABLET | Freq: Every day | ORAL | Status: DC
  Administered 2019-07-10 – 2019-07-12 (×3): 1 via ORAL
  Filled 2019-07-10 (×3): qty 1

## 2019-07-10 MED ORDER — FENTANYL CITRATE (PF) 100 MCG/2ML IJ SOLN
INTRAMUSCULAR | Status: DC | PRN
  Administered 2019-07-10: 15 ug via INTRATHECAL

## 2019-07-10 MED ORDER — SODIUM CHLORIDE 0.9% FLUSH: 3.0000 mL | INTRAVENOUS | Status: DC | PRN

## 2019-07-10 MED ORDER — SIMETHICONE 80 MG PO CHEW
80.0000 mg | CHEWABLE_TABLET | Freq: Three times a day (TID) | ORAL | Status: DC
  Administered 2019-07-10 – 2019-07-12 (×7): 80 mg via ORAL
  Filled 2019-07-10 (×7): qty 1

## 2019-07-10 MED ORDER — METOCLOPRAMIDE HCL 5 MG/ML IJ SOLN
INTRAMUSCULAR | Status: DC | PRN
  Administered 2019-07-10: 10 mg via INTRAVENOUS

## 2019-07-10 MED ORDER — ONDANSETRON HCL 4 MG/2ML IJ SOLN
INTRAMUSCULAR | Status: DC | PRN
  Administered 2019-07-10: 4 mg via INTRAVENOUS

## 2019-07-10 MED ORDER — SCOPOLAMINE 1 MG/3DAYS TD PT72
1.0000 | MEDICATED_PATCH | Freq: Once | TRANSDERMAL | Status: DC
  Administered 2019-07-10: 1.5 mg via TRANSDERMAL

## 2019-07-10 MED ORDER — BUPIVACAINE IN DEXTROSE 0.75-8.25 % IT SOLN
INTRATHECAL | Status: DC | PRN
  Administered 2019-07-10: 1.6 mL via INTRATHECAL

## 2019-07-10 MED ORDER — DEXAMETHASONE SODIUM PHOSPHATE 4 MG/ML IJ SOLN
INTRAMUSCULAR | Status: AC
  Filled 2019-07-10: qty 1

## 2019-07-10 MED ORDER — BISACODYL 10 MG RE SUPP: 10.0000 mg | Freq: Every day | RECTAL | Status: DC | PRN

## 2019-07-10 MED ORDER — MORPHINE SULFATE (PF) 0.5 MG/ML IJ SOLN
INTRAMUSCULAR | Status: AC
  Filled 2019-07-10: qty 10

## 2019-07-10 MED ORDER — ONDANSETRON HCL 4 MG/2ML IJ SOLN: 4.0000 mg | Freq: Three times a day (TID) | INTRAMUSCULAR | Status: DC | PRN

## 2019-07-10 MED ORDER — COCONUT OIL OIL
1.0000 "application " | TOPICAL_OIL | Status: DC | PRN
  Administered 2019-07-11: 1 via TOPICAL

## 2019-07-10 MED ORDER — SODIUM CHLORIDE 0.9 % IR SOLN
Status: DC | PRN
  Administered 2019-07-10: 1000 mL

## 2019-07-10 MED ORDER — DEXAMETHASONE SODIUM PHOSPHATE 4 MG/ML IJ SOLN
INTRAMUSCULAR | Status: DC | PRN
  Administered 2019-07-10: 4 mg via INTRAVENOUS

## 2019-07-10 SURGICAL SUPPLY — 36 items
BENZOIN TINCTURE PRP APPL 2/3 (GAUZE/BANDAGES/DRESSINGS) ×2 IMPLANT
CHLORAPREP W/TINT 26ML (MISCELLANEOUS) ×2 IMPLANT
CLAMP CORD UMBIL (MISCELLANEOUS) IMPLANT
CLOTH BEACON ORANGE TIMEOUT ST (SAFETY) ×2 IMPLANT
CLSR STERI-STRIP ANTIMIC 1/2X4 (GAUZE/BANDAGES/DRESSINGS) ×2 IMPLANT
DRSG OPSITE POSTOP 4X10 (GAUZE/BANDAGES/DRESSINGS) ×2 IMPLANT
ELECT REM PT RETURN 9FT ADLT (ELECTROSURGICAL) ×2
ELECTRODE REM PT RTRN 9FT ADLT (ELECTROSURGICAL) ×1 IMPLANT
EXTRACTOR VACUUM KIWI (MISCELLANEOUS) IMPLANT
GLOVE BIOGEL PI IND STRL 6.5 (GLOVE) ×1 IMPLANT
GLOVE BIOGEL PI IND STRL 7.0 (GLOVE) ×1 IMPLANT
GLOVE BIOGEL PI INDICATOR 6.5 (GLOVE) ×1
GLOVE BIOGEL PI INDICATOR 7.0 (GLOVE) ×1
GLOVE ECLIPSE 6.0 STRL STRAW (GLOVE) ×2 IMPLANT
GOWN STRL REUS W/TWL LRG LVL3 (GOWN DISPOSABLE) ×4 IMPLANT
KIT ABG SYR 3ML LUER SLIP (SYRINGE) IMPLANT
NEEDLE HYPO 25X5/8 SAFETYGLIDE (NEEDLE) IMPLANT
NS IRRIG 1000ML POUR BTL (IV SOLUTION) ×2 IMPLANT
PACK C SECTION WH (CUSTOM PROCEDURE TRAY) ×2 IMPLANT
PAD OB MATERNITY 4.3X12.25 (PERSONAL CARE ITEMS) ×2 IMPLANT
PENCIL SMOKE EVAC W/HOLSTER (ELECTROSURGICAL) ×2 IMPLANT
RTRCTR C-SECT PINK 25CM LRG (MISCELLANEOUS) ×2 IMPLANT
STRIP CLOSURE SKIN 1/2X4 (GAUZE/BANDAGES/DRESSINGS) ×2 IMPLANT
SUT MNCRL 0 VIOLET CTX 36 (SUTURE) ×2 IMPLANT
SUT MONOCRYL 0 CTX 36 (SUTURE) ×2
SUT PLAIN 0 NONE (SUTURE) IMPLANT
SUT PLAIN 2 0 (SUTURE) ×1
SUT PLAIN ABS 2-0 CT1 27XMFL (SUTURE) ×1 IMPLANT
SUT VIC AB 0 CTX 36 (SUTURE) ×2
SUT VIC AB 0 CTX36XBRD ANBCTRL (SUTURE) ×2 IMPLANT
SUT VIC AB 2-0 CT1 27 (SUTURE) ×1
SUT VIC AB 2-0 CT1 TAPERPNT 27 (SUTURE) ×1 IMPLANT
SUT VICRYL 4-0 PS2 18IN ABS (SUTURE) ×2 IMPLANT
TOWEL OR 17X24 6PK STRL BLUE (TOWEL DISPOSABLE) ×2 IMPLANT
TRAY FOLEY W/BAG SLVR 14FR LF (SET/KITS/TRAYS/PACK) ×2 IMPLANT
WATER STERILE IRR 1000ML POUR (IV SOLUTION) ×2 IMPLANT

## 2019-07-10 NOTE — Op Note (Signed)
07/10/2019  6:43 AM  PATIENT:  Denise Dawson  29 y.o. female  PRE-OPERATIVE DIAGNOSIS:  BREECH PRESENTATION  POST-OPERATIVE DIAGNOSIS:  BREECH PRESENTATION  PROCEDURE:  Procedure(s) with comments: CESAREAN SECTION (N/A) - Breech   SURGEON:  Surgeon(s) and Role:    * Carrington Clamp, MD - Primary  ANESTHESIA:   spinal  EBL:  274 mL   SPECIMEN:  No Specimen  DISPOSITION OF SPECIMEN:  N/A  COUNTS:  YES  TOURNIQUET:  * No tourniquets in log *  DICTATION: .Note written in EPIC  PLAN OF CARE: Admit to inpatient   PATIENT DISPOSITION:  PACU - hemodynamically stable.   Delay start of Pharmacological VTE agent (>24hrs) due to surgical blood loss or risk of bleeding: not applicable  Complications:  None. Medications:  Ancef, Pitocin Findings:  Baby female, Apgars 10,10, weight P.   Normal tubes, ovaries and uterus seen.  Baby was skin to skin with mother after birth in the OR.  Technique:  After adequate spinal anesthesia was achieved, the patient was prepped and draped in usual sterile fashion.  A foley catheter was used to drain the bladder.  A pfannanstiel incision was made with the scalpel and carried down to the fascia with the bovie cautery. The fascia was incised in the midline with the scalpel and carried in a transverse curvilinear manner bilaterally.  The fascia was reflected superiorly and inferiorly off the rectus muscles and the muscles split in the midline.  A bowel free portion of the peritoneum was entered bluntly and then extended in a superior and inferior manner with good visualization of the bowel and bladder.  The Alexis instrument was then placed and the vesico-uterine fascia tented up and incised in a transverse curvilinear manner.  A 2 cm transverse incision was made in the upper portion of the lower uterine segment until the amnion was exposed.   The incision was extended transversely in a blunt manner.  Clear fluid was noted and the baby delivered in the  vertex presentation without complication.  The baby was bulb suctioned and the cord was clamped and cut aftet stripping blood from cord into baby.  The baby was then handed to awaiting Neonatology.  The placenta was then delivered manually and the uterus cleared of all debris.  The uterine incision was then closed with a running lock stitch of 0 monocryl.  An imbricating layer of 0 monocryl was closed as well. Excellent hemostasis of the uterine incision was achieved and the abdomen was cleared with irrigation.  The peritoneum was closed with a running stitch of 2-0 vicryl.  This incorporated the rectus muscles as a separate layer.  The fascia was then closed with a running stitch of 0 vicryl.  The subcutaneous layer was closed with interrupted  stitches of 2-0 plain gut.  The skin was closed with 4-0 vicryl on a Keith needle and steri-strips.  The patient tolerated the procedure well and was returned to the recovery room in stable condition.  All counts were correct times three.  Loney Laurence

## 2019-07-10 NOTE — Progress Notes (Signed)
There has been no change in the patients history, status or exam since the history and physical.  Vitals:   07/09/19 1914 07/09/19 1930 07/09/19 2300 07/09/19 2337  BP: 123/84 133/75 124/66   Pulse: 94 94 94   Resp: 17  18   Temp: 98.8 F (37.1 C)  99.1 F (37.3 C)   TempSrc:   Oral   SpO2:   100%   Weight: 81.8 kg   81.8 kg  Height:    5\' 8"  (1.727 m)    Results for orders placed or performed during the hospital encounter of 07/09/19 (from the past 72 hour(s))  Respiratory Panel by RT PCR (Flu A&B, Covid) - Nasopharyngeal Swab     Status: None   Collection Time: 07/09/19  8:23 PM   Specimen: Nasopharyngeal Swab  Result Value Ref Range   SARS Coronavirus 2 by RT PCR NEGATIVE NEGATIVE    Comment: (NOTE) SARS-CoV-2 target nucleic acids are NOT DETECTED. The SARS-CoV-2 RNA is generally detectable in upper respiratoy specimens during the acute phase of infection. The lowest concentration of SARS-CoV-2 viral copies this assay can detect is 131 copies/mL. A negative result does not preclude SARS-Cov-2 infection and should not be used as the sole basis for treatment or other patient management decisions. A negative result may occur with  improper specimen collection/handling, submission of specimen other than nasopharyngeal swab, presence of viral mutation(s) within the areas targeted by this assay, and inadequate number of viral copies (<131 copies/mL). A negative result must be combined with clinical observations, patient history, and epidemiological information. The expected result is Negative. Fact Sheet for Patients:  09/08/19 Fact Sheet for Healthcare Providers:  https://www.moore.com/ This test is not yet ap proved or cleared by the https://www.young.biz/ FDA and  has been authorized for detection and/or diagnosis of SARS-CoV-2 by FDA under an Emergency Use Authorization (EUA). This EUA will remain  in effect (meaning this test can  be used) for the duration of the COVID-19 declaration under Section 564(b)(1) of the Act, 21 U.S.C. section 360bbb-3(b)(1), unless the authorization is terminated or revoked sooner.    Influenza A by PCR NEGATIVE NEGATIVE   Influenza B by PCR NEGATIVE NEGATIVE    Comment: (NOTE) The Xpert Xpress SARS-CoV-2/FLU/RSV assay is intended as an aid in  the diagnosis of influenza from Nasopharyngeal swab specimens and  should not be used as a sole basis for treatment. Nasal washings and  aspirates are unacceptable for Xpert Xpress SARS-CoV-2/FLU/RSV  testing. Fact Sheet for Patients: [place] Fact Sheet for Healthcare Providers: https://www.moore.com/ This test is not yet approved or cleared by the https://www.young.biz/ FDA and  has been authorized for detection and/or diagnosis of SARS-CoV-2 by  FDA under an Emergency Use Authorization (EUA). This EUA will remain  in effect (meaning this test can be used) for the duration of the  Covid-19 declaration under Section 564(b)(1) of the Act, 21  U.S.C. section 360bbb-3(b)(1), unless the authorization is  terminated or revoked. Performed at [facility], [address], [place], [place]   CBC on admission     Status: Abnormal   Collection Time: 07/09/19  8:46 PM  Result Value Ref Range   WBC 17.2 (H) 4.0 - 10.5 K/uL   RBC 4.71 3.87 - 5.11 MIL/uL   Hemoglobin 14.1 12.0 - 15.0 g/dL   HCT 09/08/19 81.4 - 48.1 %   MCV 92.4 80.0 - 100.0 fL   MCH 29.9 26.0 - 34.0 pg   MCHC  32.4 30.0 - 36.0 g/dL   RDW 13.0 11.5 - 15.5 %   Platelets 221 150 - 400 K/uL   nRBC 0.0 0.0 - 0.2 %    Comment: Performed at [facility], [place] [address], [place], [place] [address]  Maryann Alar Test     Status: Abnormal   Collection Time: 07/09/19  9:11 PM  Result Value Ref Range   POCT Fern Test Positive = ruptured amniotic membanes (A)   Type and screen [place]     Status: None   Collection  Time: 07/09/19 10:13 PM  Result Value Ref Range   ABO/RH(D) O POS    Antibody Screen NEG    Sample Expiration      07/12/2019,2359 Performed at [facility], [place] [address] [place], [place] [address]   ABO/Rh     Status: None (Preliminary result)   Collection Time: 07/09/19 10:13 PM  Result Value Ref Range   ABO/RH(D)      O POS Performed at [place] [address], [place],  [address]     Denise Dawson

## 2019-07-10 NOTE — Anesthesia Preprocedure Evaluation (Signed)
Anesthesia Evaluation  Patient identified by MRN, date of birth, ID band Patient awake    Reviewed: Allergy & Precautions, NPO status , Patient's Chart, lab work & pertinent test results  Airway Mallampati: II  TM Distance: >3 FB Neck ROM: Full    Dental no notable dental hx.    Pulmonary neg pulmonary ROS,    Pulmonary exam normal breath sounds clear to auscultation       Cardiovascular negative cardio ROS Normal cardiovascular exam Rhythm:Regular Rate:Normal     Neuro/Psych negative neurological ROS  negative psych ROS   GI/Hepatic negative GI ROS, Neg liver ROS,   Endo/Other  negative endocrine ROS  Renal/GU negative Renal ROS  negative genitourinary   Musculoskeletal negative musculoskeletal ROS (+)   Abdominal   Peds  Hematology negative hematology ROS (+)   Anesthesia Other Findings Primary C/S 2/2 ROM with breech presentation  Reproductive/Obstetrics (+) Pregnancy                             Anesthesia Physical Anesthesia Plan  ASA: II  Anesthesia Plan: Spinal   Post-op Pain Management:    Induction:   PONV Risk Score and Plan: Treatment may vary due to age or medical condition  Airway Management Planned: Natural Airway  Additional Equipment:   Intra-op Plan:   Post-operative Plan:   Informed Consent: I have reviewed the patients History and Physical, chart, labs and discussed the procedure including the risks, benefits and alternatives for the proposed anesthesia with the patient or authorized representative who has indicated his/her understanding and acceptance.     Dental advisory given  Plan Discussed with: CRNA  Anesthesia Plan Comments:         Anesthesia Quick Evaluation

## 2019-07-10 NOTE — Anesthesia Procedure Notes (Signed)
Spinal  Patient location during procedure: OR Start time: 07/10/2019 5:49 AM End time: 07/10/2019 5:59 AM Staffing Performed: anesthesiologist  Anesthesiologist: Elmer Picker, MD Preanesthetic Checklist Completed: patient identified, IV checked, risks and benefits discussed, surgical consent, monitors and equipment checked, pre-op evaluation and timeout performed Spinal Block Patient position: sitting Prep: DuraPrep and site prepped and draped Patient monitoring: cardiac monitor, continuous pulse ox and blood pressure Approach: midline Location: L3-4 Injection technique: single-shot Needle Needle type: Pencan  Needle gauge: 24 G Needle length: 9 cm Assessment Sensory level: T6 Additional Notes Functioning IV was confirmed and monitors were applied. Sterile prep and drape, including hand hygiene and sterile gloves were used. The patient was positioned and the spine was prepped. The skin was anesthetized with lidocaine.  Free flow of clear CSF was obtained prior to injecting local anesthetic into the CSF.  The spinal needle aspirated freely following injection.  The needle was carefully withdrawn.  The patient tolerated the procedure well.

## 2019-07-10 NOTE — Brief Op Note (Signed)
07/10/2019  6:43 AM  PATIENT:  Denise Dawson  29 y.o. female  PRE-OPERATIVE DIAGNOSIS:  BREECH PRESENTATION  POST-OPERATIVE DIAGNOSIS:  BREECH PRESENTATION  PROCEDURE:  Procedure(s) with comments: CESAREAN SECTION (N/A) - Breech   SURGEON:  Surgeon(s) and Role:    * Carrington Clamp, MD - Primary  ANESTHESIA:   spinal  EBL:  274 mL   SPECIMEN:  No Specimen  DISPOSITION OF SPECIMEN:  N/A  COUNTS:  YES  TOURNIQUET:  * No tourniquets in log *  DICTATION: .Note written in EPIC  PLAN OF CARE: Admit to inpatient   PATIENT DISPOSITION:  PACU - hemodynamically stable.   Delay start of Pharmacological VTE agent (>24hrs) due to surgical blood loss or risk of bleeding: not applicable

## 2019-07-10 NOTE — Lactation Note (Addendum)
This note was copied from a baby's chart. Lactation Consultation Note  Patient Name: Denise Dawson [id number] Date: 07/10/2019 Reason for consult: Initial assessment;1st time breastfeeding;Early term 37-38.6wks P1, 16 hour ETI female infant. Infant had one void and one stool since birth. Mom with hx: C/S delivery Per mom, she feels infant is latching well,  most feedings are now 30 to 40 minutes each in duration, Mom  feels a tug but not pain when infant latches at breast. LC did not observe latch at this time, infant last breastfeed at 9:30 pm prior to [facility] entering the room. Mom understands to breastfeed infant according to hunger cues, 8 to 12 times, on demand and not exceed 3 hours without feeding infant. Mom knows to call Rn or LC if she needs assistance with latching infant at breast. Parents will do as much STS as possible with infant.  Reviewed Baby & Me book's Breastfeeding Basics.  Mom made aware of O/P services, breastfeeding support groups, community resources, and our phone # for post-discharge questions.  Maternal Data Formula Feeding for Exclusion: No Has patient been taught Hand Expression?: Yes  Feeding Feeding Type: Breast Fed  LATCH Score                   Interventions Interventions: Skin to skin;Breast feeding basics reviewed;Hand express;Breast compression  Lactation Tools Discussed/Used WIC Program: No   Consult Status Consult Status: Follow-up Date: 07/11/19 Follow-up type: In-patient    Danelle Earthly 07/10/2019, 10:33 PM

## 2019-07-11 ENCOUNTER — Encounter: Payer: Self-pay | Admitting: Anesthesiology

## 2019-07-11 LAB — CBC
HCT: 36 % (ref 36.0–46.0)
Hemoglobin: 11.8 g/dL — ABNORMAL LOW (ref 12.0–15.0)
MCH: 30.6 pg (ref 26.0–34.0)
MCHC: 32.8 g/dL (ref 30.0–36.0)
MCV: 93.3 fL (ref 80.0–100.0)
Platelets: 211 10*3/uL (ref 150–400)
RBC: 3.86 MIL/uL — ABNORMAL LOW (ref 3.87–5.11)
RDW: 13.3 % (ref 11.5–15.5)
WBC: 20.9 10*3/uL — ABNORMAL HIGH (ref 4.0–10.5)
nRBC: 0 % (ref 0.0–0.2)

## 2019-07-11 MED ORDER — ACETAMINOPHEN 500 MG PO TABS
1000.0000 mg | ORAL_TABLET | Freq: Four times a day (QID) | ORAL | Status: DC
  Administered 2019-07-11 – 2019-07-12 (×5): 1000 mg via ORAL
  Filled 2019-07-11 (×5): qty 2

## 2019-07-11 MED ORDER — OXYCODONE HCL 5 MG PO TABS
5.0000 mg | ORAL_TABLET | ORAL | Status: DC | PRN
  Administered 2019-07-11 – 2019-07-12 (×5): 5 mg via ORAL
  Filled 2019-07-11 (×5): qty 1

## 2019-07-11 NOTE — Progress Notes (Signed)
Patient is doing well.  She is tolerating PO, ambulating, voiding.  Pain is controlled.  Lochia is appropriate  Vitals:   07/10/19 1744 07/10/19 2055 07/11/19 0105 07/11/19 0603  BP: 125/89 118/82 111/81 97/61  Pulse: 63 64 (!) 59 63  Resp: 18 18 18 18   Temp: 98.5 F (36.9 C) 98.2 F (36.8 C) 97.8 F (36.6 C) 97.6 F (36.4 C)  TempSrc:  Oral Oral Oral  SpO2: 99% 98% 99% 100%  Weight:      Height:        NAD Abdomen:  soft, appropriate tenderness, incisions intact and without erythema or drainage ext:    Symmetric, trace edema bilaterally  Lab Results  Component Value Date   WBC 20.9 (H) 07/11/2019   HGB 11.8 (L) 07/11/2019   HCT 36.0 07/11/2019   MCV 93.3 07/11/2019   PLT 211 07/11/2019    --/--/O POS, O POS Performed at [facility], [address], [place], [place]  (04/03 2213)/RImmune  A/P    29 y.o. G1P1001 POD 1 s/p primary cesarean section at term for breech / ROM Routine post op and postpartum care.   Anticipate d/c tomorrow

## 2019-07-11 NOTE — Anesthesia Postprocedure Evaluation (Signed)
Anesthesia Post Note  Patient: Denise Dawson  Procedure(s) Performed: CESAREAN SECTION (N/A )     Patient location during evaluation: PACU Anesthesia Type: Spinal Level of consciousness: oriented and awake and alert Pain management: pain level controlled Vital Signs Assessment: post-procedure vital signs reviewed and stable Respiratory status: spontaneous breathing, respiratory function stable and patient connected to nasal cannula oxygen Cardiovascular status: blood pressure returned to baseline and stable Postop Assessment: no headache, no backache and no apparent nausea or vomiting Anesthetic complications: no    Last Vitals:  Vitals:   07/11/19 0105 07/11/19 0603  BP: 111/81 97/61  Pulse: (!) 59 63  Resp: 18 18  Temp: 36.6 C 36.4 C  SpO2: 99% 100%    Last Pain:  Vitals:   07/11/19 0957  TempSrc:   PainSc: 4                  Lannie Fields

## 2019-07-11 NOTE — Transfer of Care (Signed)
Immediate Anesthesia Transfer of Care Note  Patient: Denise Dawson  Procedure(s) Performed: CESAREAN SECTION (N/A )  Patient Location: PACU  Anesthesia Type:Spinal  Level of Consciousness: awake  Airway & Oxygen Therapy: Patient Spontanous Breathing  Post-op Assessment: Report given to RN  Post vital signs: Reviewed  Last Vitals:  Vitals Value Taken Time  BP 97/61 07/11/19 0603  Temp 36.4 C 07/11/19 0603  Pulse 63 07/11/19 0603  Resp 18 07/11/19 0603  SpO2 100 % 07/11/19 0603    Last Pain:  Vitals:   07/11/19 0957  TempSrc:   PainSc: 4       Patients Stated Pain Goal: 3 (07/11/19 3244)  Complications: No apparent anesthesia complications

## 2019-07-12 MED ORDER — OXYCODONE HCL 5 MG PO TABS: 5.0000 mg | ORAL_TABLET | ORAL | 0 refills | Status: DC | PRN

## 2019-07-12 NOTE — Discharge Summary (Signed)
Obstetric Discharge Summary Reason for Admission: rupture of membranes and breech Prenatal Procedures: ultrasound Intrapartum Procedures: cesarean: low cervical, transverse, breech extraction Postpartum Procedures: none Complications-Operative and Postpartum: none Hemoglobin  Date Value Ref Range Status  07/11/2019 11.8 (L) 12.0 - 15.0 g/dL Final   HCT  Date Value Ref Range Status  07/11/2019 36.0 36.0 - 46.0 % Final    Physical Exam:  General: alert and cooperative Lochia: appropriate Uterine Fundus: firm Incision: healing well, no significant drainage DVT Evaluation: No evidence of DVT seen on physical exam.  Discharge Diagnoses: Term Pregnancy-delivered  Discharge Information: Date: 07/12/2019 Activity: pelvic rest Diet: routine Medications: PNV, Ibuprofen and Percocet Condition: stable Instructions: refer to practice specific booklet Discharge to: home   Newborn Data: Live born female  Birth Weight: 7 lb 4.6 oz (3306 g) APGAR: 10, 10  Newborn Delivery   Birth date/time: 07/10/2020 06:17:00 Delivery type: C-Section, Low Transverse Trial of labor: No C-section categorization: Primary      Home with mother.  Philip Aspen 07/12/2019, 9:48 PM

## 2019-07-12 NOTE — Lactation Note (Signed)
This note was copied from a baby's chart. Lactation Consultation Note  Patient Name: Denise Dawson ESLPN'P Date: 07/12/2019 Reason for consult: Follow-up assessment;1st time breastfeeding;Early term 37-38.6wks;Infant weight loss;Other (Comment)(7 % weight loss) Baby is 32 hours old   Maternal Data Has patient been taught Hand Expression?: Yes Does the patient have breastfeeding experience prior to this delivery?: No  Feeding Feeding Type: Breast Fed  LATCH Score Latch: Grasps breast easily, tongue down, lips flanged, rhythmical sucking.  Audible Swallowing: Spontaneous and intermittent  Type of Nipple: Everted at rest and after stimulation  Comfort (Breast/Nipple): Filling, red/small blisters or bruises, mild/mod discomfort  Hold (Positioning): Assistance needed to correctly position infant at breast and maintain latch.  LATCH Score: 8  Interventions Interventions: Breast feeding basics reviewed;Assisted with latch;Skin to skin;Breast massage;Hand express;Breast compression;Adjust position;Support pillows;Position options;Shells;Comfort gels;Hand pump  Lactation Tools Discussed/Used Tools: Shells;Pump Shell Type: Inverted Breast pump type: Manual Pump Review: Milk Storage;Setup, frequency, and cleaning Initiated by:: MAI Date initiated:: 07/12/19   Consult Status Consult Status: Follow-up Follow-up type: Out-patient    Denise Dawson 07/12/2019, 11:08 AM

## 2019-07-12 NOTE — Lactation Note (Signed)
This note was copied from a baby's chart. Lactation Consultation Note  Patient Name: Denise Dawson RJJOA'C Date: 07/12/2019 Reason for consult: Follow-up assessment;1st time breastfeeding;Early term 37-38.6wks;Infant weight loss;Other (Comment)(short - tight anterior frenulum noted - see LC note)  Baby is 18 hours old  Mom expressed concerns over sore nipples and feeling the baby has a tight frenulum . LC assessed and noted the anterior frenulum to be short to tight.  Per mom nipples are sore . LC offered to assess and both nipples are natural pink, no breakdown, LC noted areola edema , and breast are filling.  Baby latches with depth and upper lip flanges well, increased swallows noted with breast compressions and per mom comfortable.  When baby  released after 24 minutes noted the nipple to be creased on the sides.  Baby woke up 15 mins later and rooting. Mom latched with minimal assist.  Baby obtained depth , swallows and still feeding.  Sore nipple and engorgement prevention and tx reviewed.  Mom already has the comfort gels x 6 days, alternating with coconut oil  And breast shells while awake. LC also instructed mom on the use of hand pump and provided a #27 F for when the milk comes in .  Discussed nutritive vs non - nutritive feeding patterns and the importance of watching the baby for hanging out latched.  LC recommended following up with LC O/P this Thursday or Friday and LC placed a request to call mom .  And LC provided the list of referrals for oral specialist.  LC praised mom for her consistent breast feeding .  Both mom and dad expressed appreciation for LC assist .     Maternal Data Has patient been taught Hand Expression?: Yes Does the patient have breastfeeding experience prior to this delivery?: No  Feeding Feeding Type: Breast Fed  LATCH Score Latch: Grasps breast easily, tongue down, lips flanged, rhythmical sucking.  Audible Swallowing: Spontaneous and  intermittent  Type of Nipple: Everted at rest and after stimulation  Comfort (Breast/Nipple): Filling, red/small blisters or bruises, mild/mod discomfort  Hold (Positioning): Assistance needed to correctly position infant at breast and maintain latch.  LATCH Score: 8  Interventions Interventions: Breast feeding basics reviewed;Assisted with latch;Skin to skin;Breast massage;Reverse pressure;Breast compression;Adjust position;Support pillows;Position options  Lactation Tools Discussed/Used Tools: Shells;Pump;Comfort gels;Coconut oil;Flanges Flange Size: 24;27 Shell Type: Inverted Breast pump type: Manual Pump Review: Milk Storage;Setup, frequency, and cleaning Initiated by:: MAI Date initiated:: 07/12/19   Consult Status Consult Status: Follow-up Date: [facility] Placed a request for LC O/P Thursday or Friday) Follow-up type: Out-patient    Matilde Sprang [name] 07/12/2019, 11:40 AM

## 2019-07-12 NOTE — Addendum Note (Signed)
Addendum  created 07/12/19 1039 by Lannie Fields, DO   Intraprocedure Staff edited

## 2019-07-16 ENCOUNTER — Other Ambulatory Visit (HOSPITAL_COMMUNITY)
Admission: RE | Admit: 2019-07-16 | Discharge: 2019-07-16 | Disposition: A | Discharge status: Home / Self Care | Payer: Commercial Managed Care - PPO | Source: Ambulatory Visit | Attending: Obstetrics | Admitting: Obstetrics

## 2019-07-16 HISTORY — DX: Other specified health status: Z78.9

## 2019-07-18 ENCOUNTER — Inpatient Hospital Stay (HOSPITAL_COMMUNITY): Admit: 2019-07-18 | Payer: Commercial Managed Care - PPO | Admitting: Obstetrics

## 2020-11-16 LAB — OB RESULTS CONSOLE ABO/RH: RH Type: POSITIVE

## 2020-11-16 LAB — OB RESULTS CONSOLE HIV ANTIBODY (ROUTINE TESTING): HIV: NONREACTIVE

## 2020-11-16 LAB — OB RESULTS CONSOLE RUBELLA ANTIBODY, IGM: Rubella: IMMUNE

## 2020-11-16 LAB — OB RESULTS CONSOLE GC/CHLAMYDIA
Chlamydia: NEGATIVE
Gonorrhea: NEGATIVE

## 2020-11-16 LAB — OB RESULTS CONSOLE HEPATITIS B SURFACE ANTIGEN: Hepatitis B Surface Ag: NEGATIVE

## 2020-11-16 LAB — OB RESULTS CONSOLE ANTIBODY SCREEN: Antibody Screen: NEGATIVE

## 2020-11-16 LAB — OB RESULTS CONSOLE RPR: RPR: NONREACTIVE

## 2020-11-16 LAB — HEPATITIS C ANTIBODY: HCV Ab: NEGATIVE

## 2021-03-13 LAB — OB RESULTS CONSOLE RPR: RPR: NONREACTIVE

## 2021-04-07 NOTE — L&D Delivery Note (Signed)
Patient was C/C/+1 and pushed for approximately 3 hours with break for approximeately 30 min NSVD  female infant, Apgars 8/9, weight 8#13.   ?The patient had left vaginal wall extending to left posterior labial laceration repaired with 3-0 vicryl. ?Fundus was somewhat boggy, and bleeding moderate, manual removal of clot performed and TXA administered with resolution of bleeding.  ?EBL was approx 600cc ?Placenta was delivered intact. ?Vagina was clear. ? ?Delayed cord clamping done for 30-60 seconds while warming baby. ?Baby was vigorous and doing skin to skin with mother. ? ?Philip Aspen  ?

## 2021-05-10 LAB — OB RESULTS CONSOLE GBS: GBS: NEGATIVE

## 2021-05-31 ENCOUNTER — Encounter (HOSPITAL_COMMUNITY): Payer: Self-pay | Admitting: *Deleted

## 2021-05-31 ENCOUNTER — Other Ambulatory Visit: Payer: Self-pay | Admitting: Obstetrics and Gynecology

## 2021-05-31 ENCOUNTER — Telehealth (HOSPITAL_COMMUNITY): Payer: Self-pay | Admitting: *Deleted

## 2021-05-31 NOTE — Telephone Encounter (Signed)
Preadmission screen  

## 2021-06-04 ENCOUNTER — Inpatient Hospital Stay (HOSPITAL_COMMUNITY): Payer: Commercial Managed Care - PPO | Admitting: Anesthesiology

## 2021-06-04 ENCOUNTER — Inpatient Hospital Stay (HOSPITAL_COMMUNITY)
Admission: AD | Admit: 2021-06-04 | Discharge: 2021-06-06 | DRG: 807 | Disposition: A | Discharge status: Home / Self Care | Payer: Commercial Managed Care - PPO | Attending: Obstetrics and Gynecology | Admitting: Obstetrics and Gynecology

## 2021-06-04 ENCOUNTER — Other Ambulatory Visit: Payer: Self-pay

## 2021-06-04 ENCOUNTER — Encounter (HOSPITAL_COMMUNITY): Payer: Self-pay | Admitting: Obstetrics and Gynecology

## 2021-06-04 DIAGNOSIS — O34211 Maternal care for low transverse scar from previous cesarean delivery: Secondary | ICD-10-CM | POA: Diagnosis present

## 2021-06-04 DIAGNOSIS — O26893 Other specified pregnancy related conditions, third trimester: Principal | ICD-10-CM | POA: Diagnosis present

## 2021-06-04 DIAGNOSIS — Z98891 History of uterine scar from previous surgery: Secondary | ICD-10-CM

## 2021-06-04 DIAGNOSIS — R030 Elevated blood-pressure reading, without diagnosis of hypertension: Secondary | ICD-10-CM | POA: Diagnosis present

## 2021-06-04 DIAGNOSIS — Z20822 Contact with and (suspected) exposure to covid-19: Secondary | ICD-10-CM | POA: Diagnosis present

## 2021-06-04 DIAGNOSIS — Z3A39 39 weeks gestation of pregnancy: Secondary | ICD-10-CM

## 2021-06-04 DIAGNOSIS — O480 Post-term pregnancy: Secondary | ICD-10-CM | POA: Diagnosis present

## 2021-06-04 DIAGNOSIS — O34219 Maternal care for unspecified type scar from previous cesarean delivery: Secondary | ICD-10-CM

## 2021-06-04 DIAGNOSIS — Z3A41 41 weeks gestation of pregnancy: Secondary | ICD-10-CM | POA: Diagnosis present

## 2021-06-04 LAB — CBC
HCT: 42.9 % (ref 36.0–46.0)
Hemoglobin: 13.8 g/dL (ref 12.0–15.0)
MCH: 29.4 pg (ref 26.0–34.0)
MCHC: 32.2 g/dL (ref 30.0–36.0)
MCV: 91.5 fL (ref 80.0–100.0)
Platelets: 242 10*3/uL (ref 150–400)
RBC: 4.69 MIL/uL (ref 3.87–5.11)
RDW: 13.3 % (ref 11.5–15.5)
WBC: 20.4 10*3/uL — ABNORMAL HIGH (ref 4.0–10.5)
nRBC: 0 % (ref 0.0–0.2)

## 2021-06-04 LAB — RESP PANEL BY RT-PCR (FLU A&B, COVID) ARPGX2
Influenza A by PCR: NEGATIVE
Influenza B by PCR: NEGATIVE
SARS Coronavirus 2 by RT PCR: NEGATIVE

## 2021-06-04 LAB — COMPREHENSIVE METABOLIC PANEL
ALT: 18 U/L (ref 0–44)
AST: 25 U/L (ref 15–41)
Albumin: 3.2 g/dL — ABNORMAL LOW (ref 3.5–5.0)
Alkaline Phosphatase: 149 U/L — ABNORMAL HIGH (ref 38–126)
Anion gap: 12 (ref 5–15)
BUN: 10 mg/dL (ref 6–20)
CO2: 21 mmol/L — ABNORMAL LOW (ref 22–32)
Calcium: 9.5 mg/dL (ref 8.9–10.3)
Chloride: 102 mmol/L (ref 98–111)
Creatinine, Ser: 0.84 mg/dL (ref 0.44–1.00)
GFR, Estimated: >60 mL/min (ref >60)
Glucose, Bld: 76 mg/dL (ref 70–99)
Potassium: 4 mmol/L (ref 3.5–5.1)
Sodium: 135 mmol/L (ref 135–145)
Total Bilirubin: 0.5 mg/dL (ref 0.3–1.2)
Total Protein: 7.2 g/dL (ref 6.5–8.1)

## 2021-06-04 LAB — PROTEIN / CREATININE RATIO, URINE
Creatinine, Urine: 171.11 mg/dL
Protein Creatinine Ratio: 0.15 mg/mg{Cre} (ref 0.00–0.15)
Total Protein, Urine: 25 mg/dL

## 2021-06-04 LAB — TYPE AND SCREEN
ABO/RH(D): O POS
Antibody Screen: NEGATIVE

## 2021-06-04 MED ORDER — OXYTOCIN BOLUS FROM INFUSION
333.0000 mL | Freq: Once | INTRAVENOUS | Status: AC
  Administered 2021-06-05: 333 mL via INTRAVENOUS

## 2021-06-04 MED ORDER — LIDOCAINE HCL (PF) 1 % IJ SOLN: 30.0000 mL | INTRAMUSCULAR | Status: DC | PRN

## 2021-06-04 MED ORDER — ACETAMINOPHEN 325 MG PO TABS: 650.0000 mg | ORAL_TABLET | ORAL | Status: DC | PRN

## 2021-06-04 MED ORDER — LACTATED RINGERS IV SOLN: 500.0000 mL | INTRAVENOUS | Status: DC | PRN

## 2021-06-04 MED ORDER — LACTATED RINGERS IV SOLN: INTRAVENOUS | Status: DC

## 2021-06-04 MED ORDER — FENTANYL-BUPIVACAINE-NACL 0.5-0.125-0.9 MG/250ML-% EP SOLN
12.0000 mL/h | EPIDURAL | Status: DC | PRN
  Administered 2021-06-04: 12 mL/h via EPIDURAL
  Filled 2021-06-04: qty 250

## 2021-06-04 MED ORDER — OXYCODONE-ACETAMINOPHEN 5-325 MG PO TABS: 2.0000 | ORAL_TABLET | ORAL | Status: DC | PRN

## 2021-06-04 MED ORDER — OXYTOCIN-SODIUM CHLORIDE 30-0.9 UT/500ML-% IV SOLN: 2.5000 [IU]/h | INTRAVENOUS | Status: DC

## 2021-06-04 MED ORDER — OXYCODONE-ACETAMINOPHEN 5-325 MG PO TABS: 1.0000 | ORAL_TABLET | ORAL | Status: DC | PRN

## 2021-06-04 MED ORDER — TERBUTALINE SULFATE 1 MG/ML IJ SOLN: 0.2500 mg | Freq: Once | INTRAMUSCULAR | Status: DC | PRN

## 2021-06-04 MED ORDER — OXYTOCIN BOLUS FROM INFUSION: 333.0000 mL | Freq: Once | INTRAVENOUS | Status: DC

## 2021-06-04 MED ORDER — PHENYLEPHRINE 40 MCG/ML (10ML) SYRINGE FOR IV PUSH (FOR BLOOD PRESSURE SUPPORT): 80.0000 ug | PREFILLED_SYRINGE | INTRAVENOUS | Status: DC | PRN

## 2021-06-04 MED ORDER — ONDANSETRON HCL 4 MG/2ML IJ SOLN: 4.0000 mg | Freq: Four times a day (QID) | INTRAMUSCULAR | Status: DC | PRN

## 2021-06-04 MED ORDER — BUTORPHANOL TARTRATE 1 MG/ML IJ SOLN: 1.0000 mg | INTRAMUSCULAR | Status: DC | PRN

## 2021-06-04 MED ORDER — SOD CITRATE-CITRIC ACID 500-334 MG/5ML PO SOLN: 30.0000 mL | ORAL | Status: DC | PRN

## 2021-06-04 MED ORDER — ONDANSETRON HCL 4 MG/2ML IJ SOLN
4.0000 mg | Freq: Four times a day (QID) | INTRAMUSCULAR | Status: DC | PRN
  Administered 2021-06-05: 4 mg via INTRAVENOUS
  Filled 2021-06-04: qty 2

## 2021-06-04 MED ORDER — DIPHENHYDRAMINE HCL 50 MG/ML IJ SOLN: 12.5000 mg | INTRAMUSCULAR | Status: DC | PRN

## 2021-06-04 MED ORDER — FLEET ENEMA 7-19 GM/118ML RE ENEM: 1.0000 | ENEMA | RECTAL | Status: DC | PRN

## 2021-06-04 MED ORDER — EPHEDRINE 5 MG/ML INJ: 10.0000 mg | INTRAVENOUS | Status: DC | PRN

## 2021-06-04 MED ORDER — LIDOCAINE HCL (PF) 1 % IJ SOLN
INTRAMUSCULAR | Status: DC | PRN
  Administered 2021-06-04: 10 mL via EPIDURAL

## 2021-06-04 MED ORDER — LACTATED RINGERS IV SOLN
500.0000 mL | Freq: Once | INTRAVENOUS | Status: AC
  Administered 2021-06-04: 500 mL via INTRAVENOUS

## 2021-06-04 MED ORDER — OXYTOCIN-SODIUM CHLORIDE 30-0.9 UT/500ML-% IV SOLN
1.0000 m[IU]/min | INTRAVENOUS | Status: DC
  Administered 2021-06-04: 1 m[IU]/min via INTRAVENOUS
  Filled 2021-06-04: qty 500

## 2021-06-04 NOTE — MAU Note (Signed)
Presents stating she's been having ctxs every 4-7 minutes since 0600 this morning.  Reports noticed increased bloody vaginal discharge, was concerned.  Endorses +FM.  Unsure if LOF.

## 2021-06-04 NOTE — H&P (Signed)
Denise Dawson is a 31 y.o. female presenting for contractions and blood show. CTX began about 6AM, have gotten more painful and closer together throughout the day, now about q2-79m, initially q68m on MAU arrival. Bloody show noted right before leaving for hospital. +FM, denies LOF  [facility] c/b PLTCS in G1 for breech presentation. GBS neg. Patient has been counseled on ERLTCS vs TOLAC, desires TOLAC in this pregnancy   Of note, mildly elevated BP upon arrival to MAU. Denies h/o HTN, denies PreE symptoms  OB History     Gravida  2   Para  1   Term  1   Preterm      AB      Living  1      SAB      IAB      Ectopic      Multiple  0   Live Births  1          Past Medical History:  Diagnosis Date   Medical history non-contributory    Past Surgical History:  Procedure Laterality Date   CESAREAN SECTION N/A 07/10/2019   Procedure: CESAREAN SECTION;  Surgeon: Bobbye Charleston, MD;  Location: [facility];  Service: Obstetrics;  Laterality: N/A;  Breech    ROOT CANAL     WISDOM TOOTH EXTRACTION     Family History: family history includes Breast cancer in her paternal aunt and paternal aunt; Cancer in her father; Dementia in her maternal grandfather; Pancreatic cancer in her maternal grandmother. Social History:  reports that she has never smoked. She has never used smokeless tobacco. She reports that she does not currently use alcohol. She reports that she does not use drugs.     Maternal Diabetes: No 1hr 115 Genetic Screening: Normal Maternal Ultrasounds/Referrals: Normal Fetal Ultrasounds or other Referrals:  None Maternal Substance Abuse:  No Significant Maternal Medications:  None Significant Maternal Lab Results:  Group B Strep negative Other Comments:  None  Review of Systems  Constitutional:  Negative for chills and fever.  Respiratory:  Negative for shortness of breath.   Cardiovascular:  Negative for chest pain, palpitations and leg swelling.   Gastrointestinal:  Negative for abdominal pain and vomiting.  Genitourinary:  Positive for vaginal discharge (bloody show).  Neurological:  Negative for dizziness, weakness and headaches.  Psychiatric/Behavioral:  Negative for suicidal ideas.   Maternal Medical History:  Reason for admission: Contractions.   Contractions: Onset was 6-12 hours ago.   Frequency: regular.   Perceived severity is moderate.   Fetal activity: Perceived fetal activity is normal.   Prenatal complications: Bleeding (scant).   Prenatal Complications - Diabetes: none.  Dilation: 3 Effacement (%): 80 Station: -1 Exam by:: F. Morris, RNC Blood pressure 140/88, pulse (!) 104, temperature 98.5 F (36.9 C), temperature source Oral, resp. rate 18, height 5\' 8"  ([id number] m), weight 84.9 kg, SpO2 99 %, unknown if currently breastfeeding. Exam Physical Exam Constitutional:      General: She is not in acute distress.    Appearance: She is well-developed.  HENT:     Head: Normocephalic and atraumatic.  Eyes:     Pupils: Pupils are equal, round, and reactive to light.  Cardiovascular:     Rate and Rhythm: Normal rate and regular rhythm.     Heart sounds: No murmur heard.   No gallop.  Abdominal:     Tenderness: There is no abdominal tenderness. There is no guarding or rebound.  Genitourinary:    Vagina: Normal.  Musculoskeletal:        General: Normal range of motion.     Cervical back: Normal range of motion and neck supple.  Skin:    General: Skin is warm and dry.  Neurological:     Mental Status: She is alert and oriented to person, place, and time.    Prenatal labs: ABO, Rh: O/Positive/-- (08/12 0000) Antibody: Negative (08/12 0000) Rubella: Immune (08/12 0000) RPR: Nonreactive (08/12 0000)  HBsAg: Negative (08/12 0000)  HIV: Non-reactive (08/12 0000)  GBS: Negative/-- (02/03 0000)   Category 1 tracing, CE 3/90/-1 (last exam on 1/70/-3 on 2/23) TOCO q2-72min Forebag  palpated  Assessment/Plan: This is a 31yo G2P1001 @ 39 4/7 by LMP c/;w 1st trim scan presenting in latent labor. H/o PLTCS for breech, desires TOLAC. Cat 1 tracing with regular painful contractions and cervical change. Will admit to L&D at this time, pt desires epidural. Plan to AROM and assess need for pitocin at that time.    *Elevated BP in MAU - likely 2/2 pain, however will order PreE labs  R/B/A of TOLAC discussed with patient. Alternative would be scheduled elective repeat cesarean section. Successful VBAC would mean shorter postpartum stay and decreased risk of bleeding. Risks of TOLAC include risk of uterine rupture <1%; this may result in emergent section, possible transfusion and even cesarean-hysterectomy. Risks of surgery include infection of the uterus, pelvic organs, or skin, inadvertent injury to internal organs, such as bowel or bladder. If there is major injury, extensive surgery may be required. If injury is minor, it may be treated with relative ease. Discussed possibility of excessive blood loss and transfusion. If bleeding cannot be controlled using medical or minor surgical methods, a cesarean hysterectomy may be performed which would mean no future fertility. Patient accepts the possibility of blood transfusion, if necessary. Patient understands and agrees to move forward with TOLAC.    [place] 06/04/2021, 6:55 PM

## 2021-06-04 NOTE — Anesthesia Preprocedure Evaluation (Signed)
Anesthesia Evaluation  Patient identified by MRN, date of birth, ID band Patient awake    Reviewed: Allergy & Precautions, NPO status , Patient's Chart, lab work & pertinent test results  Airway Mallampati: I       Dental no notable dental hx.    Pulmonary neg pulmonary ROS,    Pulmonary exam normal        Cardiovascular negative cardio ROS Normal cardiovascular exam     Neuro/Psych negative neurological ROS  negative psych ROS   GI/Hepatic negative GI ROS, Neg liver ROS,   Endo/Other  negative endocrine ROS  Renal/GU negative Renal ROS  negative genitourinary   Musculoskeletal negative musculoskeletal ROS (+)   Abdominal Normal abdominal exam  (+)   Peds  Hematology negative hematology ROS (+)   Anesthesia Other Findings   Reproductive/Obstetrics (+) Pregnancy                             Anesthesia Physical Anesthesia Plan  ASA: II  Anesthesia Plan: Epidural   Post-op Pain Management:    Induction:   PONV Risk Score and Plan:   Airway Management Planned:   Additional Equipment:   Intra-op Plan:   Post-operative Plan:   Informed Consent: I have reviewed the patients History and Physical, chart, labs and discussed the procedure including the risks, benefits and alternatives for the proposed anesthesia with the patient or authorized representative who has indicated his/her understanding and acceptance.       Plan Discussed with:   Anesthesia Plan Comments:         Anesthesia Quick Evaluation  

## 2021-06-04 NOTE — Progress Notes (Signed)
Labor Note  S: s/p epidural, getting more comfortable  O: BP 129/75    Pulse 97    Temp 98.7 F (37.1 C) (Oral)    Resp 18    Ht 5\' 8"  (1.727 m)    Wt 84.9 kg    SpO2 100%    BMI 28.46 kg/m  CE: Bloody-tinged but mostly clear fluid, 4/90/-1 FHR: Baseline 125, +accels, -decels, min to mod variability TOCO 4-5  A/P: This is a 31 y.o. G2P1001 at [redacted]w[redacted]d  admitted in latent labor, TOLAC pt, GBS neg. Now meets criteria for GHTN based on BP FWB: cat 1 tracing  MWB: s/p epidural. Asx from GHTN, labs WNL Labor course: s/p AROM, will recheck to see if pitocin needed for augmentation, IUPC at that time if needed  Anticipate SVD

## 2021-06-04 NOTE — Anesthesia Procedure Notes (Signed)
Epidural Patient location during procedure: OB Start time: 06/04/2021 8:50 PM End time: 06/04/2021 8:53 PM  Staffing Anesthesiologist: Lyn Hollingshead, MD Performed: anesthesiologist   Preanesthetic Checklist Completed: patient identified, IV checked, site marked, risks and benefits discussed, surgical consent, monitors and equipment checked, pre-op evaluation and timeout performed  Epidural Patient position: sitting Prep: DuraPrep and site prepped and draped Patient monitoring: continuous pulse ox and blood pressure Approach: midline Location: L3-L4 Injection technique: LOR air  Needle:  Needle type: Tuohy  Needle gauge: 17 G Needle length: 9 cm and 9 Needle insertion depth: 5 cm cm Catheter type: closed end flexible Catheter size: 19 Gauge Catheter at skin depth: 10 cm Test dose: negative and Other  Assessment Events: blood not aspirated, injection not painful, no injection resistance, no paresthesia and negative IV test  Additional Notes Reason for block:procedure for pain

## 2021-06-04 NOTE — Progress Notes (Signed)
Difficult tracing contractions, FWB still category 1. Patient amenable to IUPC placement, placed without difficulty, CE 5/90/-1, no augmentation at this time.TOCO q33m, pt would like to hold off on FSE placement unless absolutely necessary.  BP (!) 120/52    Pulse 100    Temp 98.7 F (37.1 C) (Oral)    Resp 18    Ht 5\' 8"  (1.727 m)    Wt 84.9 kg    SpO2 100%    BMI 28.46 kg/m

## 2021-06-05 ENCOUNTER — Encounter (HOSPITAL_COMMUNITY): Payer: Self-pay | Admitting: Obstetrics and Gynecology

## 2021-06-05 LAB — RPR: RPR Ser Ql: NONREACTIVE

## 2021-06-05 MED ORDER — TRANEXAMIC ACID-NACL 1000-0.7 MG/100ML-% IV SOLN: 1000.0000 mg | Freq: Once | INTRAVENOUS | Status: DC

## 2021-06-05 MED ORDER — SODIUM CHLORIDE 0.9 % IV SOLN: INTRAVENOUS | Status: DC

## 2021-06-05 MED ORDER — WITCH HAZEL-GLYCERIN EX PADS
1.0000 | MEDICATED_PAD | CUTANEOUS | Status: DC | PRN
Start: 2021-06-05 — End: 2021-06-06

## 2021-06-05 MED ORDER — TRANEXAMIC ACID-NACL 1000-0.7 MG/100ML-% IV SOLN
INTRAVENOUS | Status: AC
  Administered 2021-06-05: 1000 mg
  Filled 2021-06-05: qty 100

## 2021-06-05 MED ORDER — ONDANSETRON HCL 4 MG/2ML IJ SOLN: 4.0000 mg | INTRAMUSCULAR | Status: DC | PRN

## 2021-06-05 MED ORDER — SIMETHICONE 80 MG PO CHEW: 80.0000 mg | CHEWABLE_TABLET | ORAL | Status: DC | PRN

## 2021-06-05 MED ORDER — COCONUT OIL OIL: 1.0000 "application " | TOPICAL_OIL | Status: DC | PRN

## 2021-06-05 MED ORDER — DIBUCAINE (PERIANAL) 1 % EX OINT: 1.0000 "application " | TOPICAL_OINTMENT | CUTANEOUS | Status: DC | PRN

## 2021-06-05 MED ORDER — SENNOSIDES-DOCUSATE SODIUM 8.6-50 MG PO TABS
2.0000 | ORAL_TABLET | Freq: Every day | ORAL | Status: DC
  Administered 2021-06-06: 2 via ORAL
  Filled 2021-06-05: qty 2

## 2021-06-05 MED ORDER — OXYCODONE HCL 5 MG PO TABS: 5.0000 mg | ORAL_TABLET | ORAL | Status: DC | PRN

## 2021-06-05 MED ORDER — ZOLPIDEM TARTRATE 5 MG PO TABS: 5.0000 mg | ORAL_TABLET | Freq: Every evening | ORAL | Status: DC | PRN

## 2021-06-05 MED ORDER — ACETAMINOPHEN 325 MG PO TABS
650.0000 mg | ORAL_TABLET | ORAL | Status: DC | PRN
Start: 2021-06-05 — End: 2021-06-06
  Administered 2021-06-05 – 2021-06-06 (×2): 650 mg via ORAL
  Filled 2021-06-05 (×2): qty 2

## 2021-06-05 MED ORDER — IBUPROFEN 600 MG PO TABS
600.0000 mg | ORAL_TABLET | Freq: Four times a day (QID) | ORAL | Status: DC
  Administered 2021-06-05 – 2021-06-06 (×5): 600 mg via ORAL
  Filled 2021-06-05 (×5): qty 1

## 2021-06-05 MED ORDER — BENZOCAINE-MENTHOL 20-0.5 % EX AERO
1.0000 "application " | INHALATION_SPRAY | CUTANEOUS | Status: DC | PRN
  Filled 2021-06-05: qty 56

## 2021-06-05 MED ORDER — PRENATAL MULTIVITAMIN CH
1.0000 | ORAL_TABLET | Freq: Every day | ORAL | Status: DC
  Administered 2021-06-06: 1 via ORAL
  Filled 2021-06-05: qty 1

## 2021-06-05 MED ORDER — TETANUS-DIPHTH-ACELL PERTUSSIS 5-2.5-18.5 LF-MCG/0.5 IM SUSY: 0.5000 mL | PREFILLED_SYRINGE | Freq: Once | INTRAMUSCULAR | Status: DC

## 2021-06-05 MED ORDER — ONDANSETRON HCL 4 MG PO TABS: 4.0000 mg | ORAL_TABLET | ORAL | Status: DC | PRN

## 2021-06-05 MED ORDER — OXYCODONE HCL 5 MG PO TABS
10.0000 mg | ORAL_TABLET | ORAL | Status: DC | PRN
  Administered 2021-06-06 (×3): 10 mg via ORAL
  Filled 2021-06-05 (×3): qty 2

## 2021-06-05 MED ORDER — DIPHENHYDRAMINE HCL 25 MG PO CAPS: 25.0000 mg | ORAL_CAPSULE | Freq: Four times a day (QID) | ORAL | Status: DC | PRN

## 2021-06-05 NOTE — Lactation Note (Signed)
This note was copied from a baby's chart. ?Lactation Consultation Note ? ?Patient Name: Denise Dawson ?Today's Date: 06/05/2021 ?Reason for consult: Initial assessment;Term ?Age:32 hours ? ?LC in to visit with [name] of term baby delivered by VBAC.  Mom states baby has latched and fed for 15 mins once since birth.  Baby is sleeping swaddled in her crib.  Mom about to eat her lunch. ? ?Reviewed breast massage and hand expression.  Colostrum drops expressed from both breasts.   ? ?Encouraged keeping baby STS on her chest to encouraged baby to feed.  Offered to bring baby to Mom, but she declined saying she needed some time to recoup from the birth.  Mom understands the benefits of STS in the first days of life. ? ?Lactation Service handout provided and placed in MB booklet.  ?Mom to ask for assistance prn. ? ?Interventions ?Interventions: Breast feeding basics reviewed;Skin to skin;Breast massage;Hand express ? ?Consult Status ?Consult Status: Follow-up ?Date: 06/06/21 ?Follow-up type: In-patient ? ? ? ?Tilda Burrow E ?06/05/2021, 2:20 PM ? ? ? ?

## 2021-06-05 NOTE — Progress Notes (Signed)
Cat 1 tracing, TOCO spacing to q12m. Will begin augmentation with pitocin 1x1 at this time, IUPC reading well ?BP (!) 106/56   Pulse 89   Temp 98.7 ?F (37.1 ?C) (Oral)   Resp 18   Ht 5\' 8"  (1.727 m)   Wt 84.9 kg   SpO2 100%   BMI 28.46 kg/m?  ? ?

## 2021-06-05 NOTE — Lactation Note (Signed)
This note was copied from a baby's chart. ?Lactation Consultation Note ? ?Patient Name: Denise Dawson ?Today's Date: 06/05/2021 ?Reason for consult: Mother's request;Difficult latch ?Age:32 hours ?Mom requested [place] services to assist with latch, she feels her daughter has a shallow latch and she was concern due to  having large nipples. ?Mom re-latched infant on her left breast using the cross cradle hold. ?[place] ask mom to express a small amount of colostrum prior to latching infant, infant open mouth wide, nose and chin touching breast, swallows heard, infant latched with depth, infant was still breastfeeding after 10 minutes when LC left the room.  ?Mom will continue to breastfeed infant according to hunger cues, 8 to 12+ or more times, skin to skin. ?Mom will attempt to latch infant on both breast during a feeding. ?Mom knows to break latch if she feels pain and not a tug and to continue to ask RN/LC for further latch assistance if needed. ? ? ?Maternal Data ?  ? ?Feeding ?Mother's Current Feeding Choice: Breast Milk ? ?LATCH Score ?Latch: Grasps breast easily, tongue down, lips flanged, rhythmical sucking. ? ?Audible Swallowing: Spontaneous and intermittent ? ?Type of Nipple: Everted at rest and after stimulation ? ?Comfort (Breast/Nipple): Soft / non-tender ? ?Hold (Positioning): Assistance needed to correctly position infant at breast and maintain latch. ? ?LATCH Score: 9 ? ? ?Lactation Tools Discussed/Used ?  ? ?Interventions ?Interventions: Skin to skin;Breast compression;Adjust position;Support pillows;Position options;Expressed milk;Education ? ?Discharge ?  ? ?Consult Status ?Consult Status: Follow-up ?Date: 06/06/21 ?Follow-up type: In-patient ? ? ? ?[name] ?06/05/2021, 6:37 PM ? ? ? ?

## 2021-06-05 NOTE — Lactation Note (Signed)
This note was copied from a baby's chart. ?Lactation Consultation Note ? ?Patient Name: Denise Dawson ?Today's Date: 06/05/2021 ?  ?Age:32 hours ? ?Attempted consulting with Motherx2 but mother is still shaking and would prefer for [place] to visit on MBU. ? ? ?Denise Dawson ?06/05/2021, 10:46 AM ? ? ? ?

## 2021-06-06 LAB — CBC
HCT: 31.8 % — ABNORMAL LOW (ref 36.0–46.0)
Hemoglobin: 10.8 g/dL — ABNORMAL LOW (ref 12.0–15.0)
MCH: 30.7 pg (ref 26.0–34.0)
MCHC: 34 g/dL (ref 30.0–36.0)
MCV: 90.3 fL (ref 80.0–100.0)
Platelets: 203 10*3/uL (ref 150–400)
RBC: 3.52 MIL/uL — ABNORMAL LOW (ref 3.87–5.11)
RDW: 13.6 % (ref 11.5–15.5)
WBC: 32.3 10*3/uL — ABNORMAL HIGH (ref 4.0–10.5)
nRBC: 0 % (ref 0.0–0.2)

## 2021-06-06 MED ORDER — IBUPROFEN 600 MG PO TABS: 600.0000 mg | ORAL_TABLET | Freq: Four times a day (QID) | ORAL | 1 refills | Status: AC | PRN

## 2021-06-06 NOTE — Progress Notes (Signed)
Post Partum Day 1 ?Subjective: ?no complaints, up ad lib, voiding, tolerating PO, + flatus, and lochia mild. She denies CP, HA or SOB. No pain with voiding. Bleeding mild- moderate only. She is bonding well with daughter. She would like discharge to home today  ? ?Objective: ?Blood pressure 106/67, pulse 84, temperature 98 ?F (36.7 ?C), temperature source Oral, resp. rate 16, height 5\' 8"  (1.727 m), weight 84.9 kg, SpO2 100 %, unknown if currently breastfeeding. ? ?Physical Exam:  ?General: alert, cooperative, and no distress ?Lochia: appropriate ?Uterine Fundus: firm ?Incision: n/a ?DVT Evaluation: No evidence of DVT seen on physical exam. ?No significant calf/ankle edema. ? ?Recent Labs  ?  06/04/21 ?1920 06/06/21 ?08/06/21  ?HGB 13.8 10.8*  ?HCT 42.9 31.8*  ? ? ?Assessment/Plan: ?Discharge home and Breastfeeding ?Reviewed instructions.  ?F/U in 6 weeks ? ? LOS: 2 days  ? ?7322 ?06/06/2021, 10:10 AM  ? ? ?

## 2021-06-06 NOTE — Anesthesia Postprocedure Evaluation (Signed)
Anesthesia Post Note ? ?Patient: Denise Dawson ? ?Procedure(s) Performed: AN AD HOC LABOR EPIDURAL ? ?  ? ?Patient location during evaluation: Mother Baby ?Anesthesia Type: Epidural ?Level of consciousness: awake, oriented and awake and alert ?Pain management: pain level controlled ?Vital Signs Assessment: post-procedure vital signs reviewed and stable ?Respiratory status: spontaneous breathing, respiratory function stable and nonlabored ventilation ?Cardiovascular status: stable ?Postop Assessment: adequate PO intake, able to ambulate, patient able to bend at knees, no apparent nausea or vomiting and no headache ?Anesthetic complications: no ? ? ?No notable events documented. ? ?Last Vitals:  ?Vitals:  ? 06/06/21 0100 06/06/21 0509  ?BP: 108/82 106/67  ?Pulse: 88 84  ?Resp: 18 16  ?Temp: 36.9 ?C 36.7 ?C  ?SpO2: 100% 100%  ?  ?Last Pain:  ?Vitals:  ? 06/06/21 0821  ?TempSrc:   ?PainSc: 0-No pain  ? ?Pain Goal: Patients Stated Pain Goal: 4 (06/06/21 0509) ? ?  ?  ?  ?  ?  ?  ?  ? ?Antonia [facility] ? ? ? ? ?

## 2021-06-06 NOTE — Discharge Summary (Signed)
? ?  Postpartum Discharge Summary ? ?Date of Service updated  ? ?   ?Patient Name: Denise Dawson ?DOB: 03-26-1991 ?MRN: [id number] ? ?Date of admission: 06/04/2021 ?Delivery date:06/05/2021  ?Delivering provider: Philip Aspen  ?Date of discharge: 06/06/2021 ? ?Admitting diagnosis: History of cesarean section [Z98.891] ?Post term pregnancy at [redacted] weeks gestation [O48.0, Z3A.41] ?Intrauterine pregnancy: [redacted]w[redacted]d     ?Secondary diagnosis:  Principal Problem: ?  History of cesarean section ?Active Problems: ?  Post term pregnancy at [redacted] weeks gestation ? ?Additional problems: pp hemorrhage    ?Discharge diagnosis: Term Pregnancy Delivered and VBAC                                              ?Post partum procedures: n/a ?Augmentation: AROM and Pitocin ?Complications: None ? ?Hospital course: Onset of Labor With Vaginal Delivery      ?31 y.o. yo G2P2002 at [redacted]w[redacted]d was admitted in Latent Labor on 06/04/2021. Patient had an uncomplicated labor course as follows:  ?Membrane Rupture Time/Date: 9:29 PM ,06/04/2021   ?Elevated BP on admission resolved with pain control and stayed in normal range postpartum - no meds needed. Was likely due to pain with contractions.  ?Delivery Method:VBAC, Spontaneous  ?Episiotomy: None  ?Lacerations:  Vaginal;Labial  ?Patient had an uncomplicated postpartum course.  She is ambulating, tolerating a regular diet, passing flatus, and urinating well. Patient is discharged home in stable condition on 06/06/21. ? ?Newborn Data: ?Birth date:06/05/2021  ?Birth time:9:55 AM  ?Gender:Female  ?Living status:Living  ?Apgars:8 ,9  ?Weight:3990 g  ? ?Magnesium Sulfate received: No ?BMZ received: No ?Rhophylac:N/A ? ?Physical exam  ?Vitals:  ? 06/05/21 1330 06/05/21 1720 06/06/21 0100 06/06/21 0509  ?BP: 118/86 115/69 108/82 106/67  ?Pulse: 96 83 88 84  ?Resp: 18 18 18 16   ?Temp: 98.6 ?F (37 ?C) 98.7 ?F (37.1 ?C) 98.4 ?F (36.9 ?C) 98 ?F (36.7 ?C)  ?TempSrc: Oral Oral Oral Oral  ?SpO2:   100% 100%  ?Weight:       ?Height:      ? ?General: alert, cooperative, and no distress ?Lochia: appropriate ?Uterine Fundus: firm ?Incision: N/A ?DVT Evaluation: No evidence of DVT seen on physical exam. ?Labs: ?Lab Results  ?Component Value Date  ? WBC 32.3 (H) 06/06/2021  ? HGB 10.8 (L) 06/06/2021  ? HCT 31.8 (L) 06/06/2021  ? MCV 90.3 06/06/2021  ? PLT 203 06/06/2021  ? ?CMP Latest Ref Rng & Units 06/04/2021  ?Glucose 70 - 99 mg/dL 76  ?BUN 6 - 20 mg/dL 10  ?Creatinine 0.44 - 1.00 mg/dL 06/06/2021  ?Sodium 135 - 145 mmol/L 135  ?Potassium 3.5 - 5.1 mmol/L 4.0  ?Chloride 98 - 111 mmol/L 102  ?CO2 22 - 32 mmol/L 21(L)  ?Calcium 8.9 - 10.3 mg/dL 9.5  ?Total Protein 6.5 - 8.1 g/dL 7.2  ?Total Bilirubin 0.3 - 1.2 mg/dL 0.5  ?Alkaline Phos 38 - 126 U/L 149(H)  ?AST 15 - 41 U/L 25  ?ALT 0 - 44 U/L 18  ? ?Edinburgh Score: ?Edinburgh Postnatal Depression Scale Screening Tool 06/05/2021  ?I have been able to laugh and see the funny side of things. 0  ?I have looked forward with enjoyment to things. 0  ?I have blamed myself unnecessarily when things went wrong. 0  ?I have been anxious or worried for no good reason. 2  ?I have felt scared or  panicky for no good reason. 1  ?Things have been getting on top of me. 0  ?I have been so unhappy that I have had difficulty sleeping. 0  ?I have felt sad or miserable. 0  ?I have been so unhappy that I have been crying. 1  ?The thought of harming myself has occurred to me. 0  ?Edinburgh Postnatal Depression Scale Total 4  ? ? ? ? ?After visit meds:  ?Allergies as of 06/06/2021   ? ?   Reactions  ? Penicillins Nausea Only  ? ?  ? ?  ?Medication List  ?  ? ?STOP taking these medications   ? ?oxyCODONE 5 MG immediate release tablet ?Commonly known as: Oxy IR/ROXICODONE ?  ? ?  ? ?TAKE these medications   ? ?docusate sodium 100 MG capsule ?Commonly known as: COLACE ?Take 100 mg by mouth 2 (two) times daily. ?  ?fexofenadine-pseudoephedrine 180-240 MG 24 hr tablet ?Commonly known as: ALLEGRA-D 24 ?Take 1 tablet by mouth  daily. ?  ?ibuprofen 600 MG tablet ?Commonly known as: ADVIL ?Take 1 tablet (600 mg total) by mouth every 6 (six) hours as needed for moderate pain or cramping. ?  ?prenatal multivitamin Tabs tablet ?Take 1 tablet by mouth daily at 12 noon. ?  ? ?  ? ? ? ?Discharge home in stable condition ?Infant Feeding: Breast ?Infant Disposition:home with mother ?Discharge instruction: per After Visit Summary and Postpartum booklet. ?Activity: Advance as tolerated. Pelvic rest for 6 weeks.  ?Diet: routine diet ?Anticipated Birth Control: Unsure ?Postpartum Appointment:6 weeks ?Additional Postpartum F/U: Postpartum Depression checkup ?Future Appointments:No future appointments. ?Follow up Visit: ? Follow-up Information   ? ? Ob/Gyn, Nestor Ramp. Schedule an appointment as soon as possible for a visit in 6 week(s).   ?Why: For postpartum visit ?Contact information: ?[address] ?[address] ?[place] [address] ?[phone] ? ? ?  ?  ? ?  ?  ? ?  ? ? ? ?  ? ?06/06/2021 ?Cathrine Muster, DO ? ? ?

## 2021-06-06 NOTE — Discharge Instructions (Signed)
Call office with any concerns [phone] 

## 2021-06-06 NOTE — Lactation Note (Signed)
This note was copied from a baby's chart. ?Lactation Consultation Note ? ?Patient Name: Denise Dawson ?Today's Date: 06/06/2021 ?Reason for consult: Follow-up assessment;Term ?Age:32 hours ? ?LC in to visit with [name] of term baby on day of discharge.  Mom asleep and FOB holding sleeping baby while he is reclined on couch.  FOB states all is well with feedings.  LC shared that Mom can call Lactation Services for any questions. ? ?Consult Status ?Consult Status: Complete ?Date: 06/06/21 ?Follow-up type: Call as needed ? ? ? ?Johny Blamer E ?06/06/2021, 2:38 PM ? ? ? ?

## 2021-06-07 ENCOUNTER — Inpatient Hospital Stay (HOSPITAL_COMMUNITY): Payer: Commercial Managed Care - PPO

## 2021-06-07 ENCOUNTER — Inpatient Hospital Stay (HOSPITAL_COMMUNITY)
Admission: AD | Admit: 2021-06-07 | Payer: Commercial Managed Care - PPO | Source: Home / Self Care | Admitting: Obstetrics and Gynecology

## 2021-06-14 ENCOUNTER — Inpatient Hospital Stay (HOSPITAL_COMMUNITY)
Admission: AD | Admit: 2021-06-14 | Discharge: 2021-06-14 | Disposition: A | Discharge status: Home / Self Care | Payer: Commercial Managed Care - PPO | Attending: Obstetrics and Gynecology | Admitting: Obstetrics and Gynecology

## 2021-06-14 ENCOUNTER — Other Ambulatory Visit: Payer: Self-pay

## 2021-06-14 ENCOUNTER — Encounter (HOSPITAL_COMMUNITY): Payer: Self-pay | Admitting: Obstetrics and Gynecology

## 2021-06-14 DIAGNOSIS — Z98891 History of uterine scar from previous surgery: Secondary | ICD-10-CM | POA: Diagnosis not present

## 2021-06-14 DIAGNOSIS — O864 Pyrexia of unknown origin following delivery: Secondary | ICD-10-CM | POA: Diagnosis not present

## 2021-06-14 DIAGNOSIS — N3001 Acute cystitis with hematuria: Secondary | ICD-10-CM

## 2021-06-14 LAB — URINALYSIS, ROUTINE W REFLEX MICROSCOPIC
Bilirubin Urine: NEGATIVE
Glucose, UA: NEGATIVE mg/dL
Ketones, ur: 5 mg/dL — AB
Nitrite: NEGATIVE
Protein, ur: 100 mg/dL — AB
RBC / HPF: >50 RBC/hpf — ABNORMAL HIGH (ref 0–5)
Specific Gravity, Urine: 1.04 — ABNORMAL HIGH (ref 1.005–1.030)
WBC, UA: >50 WBC/hpf — ABNORMAL HIGH (ref 0–5)
pH: 5 (ref 5.0–8.0)

## 2021-06-14 LAB — CBC WITH DIFFERENTIAL/PLATELET
Abs Immature Granulocytes: 0.52 10*3/uL — ABNORMAL HIGH (ref 0.00–0.07)
Basophils Absolute: 0.1 10*3/uL (ref 0.0–0.1)
Basophils Relative: 1 %
Eosinophils Absolute: 0.7 10*3/uL — ABNORMAL HIGH (ref 0.0–0.5)
Eosinophils Relative: 4 %
HCT: 32.6 % — ABNORMAL LOW (ref 36.0–46.0)
Hemoglobin: 10.7 g/dL — ABNORMAL LOW (ref 12.0–15.0)
Immature Granulocytes: 3 %
Lymphocytes Relative: 8 %
Lymphs Abs: 1.5 10*3/uL (ref 0.7–4.0)
MCH: 29.3 pg (ref 26.0–34.0)
MCHC: 32.8 g/dL (ref 30.0–36.0)
MCV: 89.3 fL (ref 80.0–100.0)
Monocytes Absolute: 0.8 10*3/uL (ref 0.1–1.0)
Monocytes Relative: 4 %
Neutro Abs: 14.5 10*3/uL — ABNORMAL HIGH (ref 1.7–7.7)
Neutrophils Relative %: 80 %
Platelets: 487 10*3/uL — ABNORMAL HIGH (ref 150–400)
RBC: 3.65 MIL/uL — ABNORMAL LOW (ref 3.87–5.11)
RDW: 13.2 % (ref 11.5–15.5)
WBC: 18.1 10*3/uL — ABNORMAL HIGH (ref 4.0–10.5)
nRBC: 0 % (ref 0.0–0.2)

## 2021-06-14 MED ORDER — SULFAMETHOXAZOLE-TRIMETHOPRIM 800-160 MG PO TABS: 1.0000 | ORAL_TABLET | Freq: Two times a day (BID) | ORAL | 0 refills | Status: DC

## 2021-06-14 NOTE — MAU Note (Signed)
.  Denise Dawson is a 31 y.o. at Unknown here in MAU reporting: pp VAG DELIVERY 06/05/2021. Pt was sent home with clindamycin for fever and abd pain post op. Has been taking tylenol and ibuprofen as well for fever and abd pain. Did not take tylenol yesterday and fever spiked to 101.2 this afternoon. Called ob and was told to come in to make sure infection was not getting worse. Took tylenol about 1.5 hours ago and pain is minimal and temp 99.1 ? ?Onset of complaint: today  ?Pain score: 2 ?Vitals:  ? 06/14/21 1812  ?BP: 124/74  ?Pulse: 100  ?Resp: 18  ?Temp: 99.1 ?F (37.3 ?C)  ?SpO2: 100%  ?   ? ?Lab orders placed from triage:  none ? ?
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
## 2021-06-15 ENCOUNTER — Telehealth (HOSPITAL_COMMUNITY): Payer: Self-pay

## 2021-06-15 NOTE — Telephone Encounter (Signed)
?  No answer. Left message to return nurse call. ? Denise Dawson ?06/15/2021,1019 ?

## 2021-06-16 LAB — CULTURE, OB URINE

## 2021-06-19 ENCOUNTER — Inpatient Hospital Stay (HOSPITAL_COMMUNITY): Payer: Commercial Managed Care - PPO

## 2021-06-19 ENCOUNTER — Inpatient Hospital Stay (HOSPITAL_COMMUNITY)
Admission: AD | Admit: 2021-06-19 | Discharge: 2021-06-23 | DRG: 776 | Disposition: A | Discharge status: Home / Self Care | Payer: Commercial Managed Care - PPO | Attending: Obstetrics and Gynecology | Admitting: Obstetrics and Gynecology

## 2021-06-19 ENCOUNTER — Other Ambulatory Visit: Payer: Self-pay

## 2021-06-19 DIAGNOSIS — Z880 Allergy status to penicillin: Secondary | ICD-10-CM | POA: Diagnosis not present

## 2021-06-19 DIAGNOSIS — O864 Pyrexia of unknown origin following delivery: Secondary | ICD-10-CM | POA: Diagnosis present

## 2021-06-19 DIAGNOSIS — N179 Acute kidney failure, unspecified: Secondary | ICD-10-CM | POA: Diagnosis present

## 2021-06-19 DIAGNOSIS — O8612 Endometritis following delivery: Principal | ICD-10-CM | POA: Diagnosis present

## 2021-06-19 DIAGNOSIS — O904 Postpartum acute kidney failure: Secondary | ICD-10-CM | POA: Diagnosis present

## 2021-06-19 DIAGNOSIS — Z20822 Contact with and (suspected) exposure to covid-19: Secondary | ICD-10-CM | POA: Diagnosis present

## 2021-06-19 LAB — RESP PANEL BY RT-PCR (FLU A&B, COVID) ARPGX2
Influenza A by PCR: NEGATIVE
Influenza B by PCR: NEGATIVE
SARS Coronavirus 2 by RT PCR: NEGATIVE

## 2021-06-19 LAB — COMPREHENSIVE METABOLIC PANEL
ALT: 10 U/L (ref 0–44)
AST: 12 U/L — ABNORMAL LOW (ref 15–41)
Albumin: 2.6 g/dL — ABNORMAL LOW (ref 3.5–5.0)
Alkaline Phosphatase: 113 U/L (ref 38–126)
Anion gap: 8 (ref 5–15)
BUN: 21 mg/dL — ABNORMAL HIGH (ref 6–20)
CO2: 25 mmol/L (ref 22–32)
Calcium: 8.8 mg/dL — ABNORMAL LOW (ref 8.9–10.3)
Chloride: 102 mmol/L (ref 98–111)
Creatinine, Ser: 1.1 mg/dL — ABNORMAL HIGH (ref 0.44–1.00)
GFR, Estimated: >60 mL/min (ref >60)
Glucose, Bld: 93 mg/dL (ref 70–99)
Potassium: 4.2 mmol/L (ref 3.5–5.1)
Sodium: 135 mmol/L (ref 135–145)
Total Bilirubin: 0.3 mg/dL (ref 0.3–1.2)
Total Protein: 6.6 g/dL (ref 6.5–8.1)

## 2021-06-19 LAB — CBC
HCT: 31.4 % — ABNORMAL LOW (ref 36.0–46.0)
Hemoglobin: 10.4 g/dL — ABNORMAL LOW (ref 12.0–15.0)
MCH: 29.5 pg (ref 26.0–34.0)
MCHC: 33.1 g/dL (ref 30.0–36.0)
MCV: 89 fL (ref 80.0–100.0)
Platelets: 536 10*3/uL — ABNORMAL HIGH (ref 150–400)
RBC: 3.53 MIL/uL — ABNORMAL LOW (ref 3.87–5.11)
RDW: 13.4 % (ref 11.5–15.5)
WBC: 20 10*3/uL — ABNORMAL HIGH (ref 4.0–10.5)
nRBC: 0 % (ref 0.0–0.2)

## 2021-06-19 MED ORDER — TETANUS-DIPHTH-ACELL PERTUSSIS 5-2.5-18.5 LF-MCG/0.5 IM SUSY: 0.5000 mL | PREFILLED_SYRINGE | Freq: Once | INTRAMUSCULAR | Status: DC

## 2021-06-19 MED ORDER — METHYLERGONOVINE MALEATE 0.2 MG/ML IJ SOLN: 0.2000 mg | INTRAMUSCULAR | Status: DC | PRN

## 2021-06-19 MED ORDER — MEASLES, MUMPS & RUBELLA VAC IJ SOLR: 0.5000 mL | Freq: Once | INTRAMUSCULAR | Status: DC

## 2021-06-19 MED ORDER — WITCH HAZEL-GLYCERIN EX PADS: 1.0000 "application " | MEDICATED_PAD | CUTANEOUS | Status: DC | PRN

## 2021-06-19 MED ORDER — MAGNESIUM HYDROXIDE 400 MG/5ML PO SUSP: 30.0000 mL | ORAL | Status: DC | PRN

## 2021-06-19 MED ORDER — ACETAMINOPHEN 325 MG PO TABS
650.0000 mg | ORAL_TABLET | ORAL | Status: DC | PRN
  Administered 2021-06-21: 650 mg via ORAL
  Filled 2021-06-19: qty 2

## 2021-06-19 MED ORDER — LORATADINE 10 MG PO TABS
10.0000 mg | ORAL_TABLET | Freq: Every day | ORAL | Status: DC
  Administered 2021-06-20 – 2021-06-23 (×3): 10 mg via ORAL
  Filled 2021-06-19 (×4): qty 1

## 2021-06-19 MED ORDER — DIPHENHYDRAMINE HCL 25 MG PO CAPS: 25.0000 mg | ORAL_CAPSULE | Freq: Four times a day (QID) | ORAL | Status: DC | PRN

## 2021-06-19 MED ORDER — ONDANSETRON HCL 4 MG PO TABS: 4.0000 mg | ORAL_TABLET | ORAL | Status: DC | PRN

## 2021-06-19 MED ORDER — DIBUCAINE (PERIANAL) 1 % EX OINT: 1.0000 "application " | TOPICAL_OINTMENT | CUTANEOUS | Status: DC | PRN

## 2021-06-19 MED ORDER — GADOBUTROL 1 MMOL/ML IV SOLN
8.0000 mL | Freq: Once | INTRAVENOUS | Status: AC | PRN
  Administered 2021-06-19: 8 mL via INTRAVENOUS

## 2021-06-19 MED ORDER — LACTATED RINGERS IV SOLN: INTRAVENOUS | Status: DC

## 2021-06-19 MED ORDER — SODIUM CHLORIDE 0.9% FLUSH: 3.0000 mL | INTRAVENOUS | Status: DC | PRN

## 2021-06-19 MED ORDER — SIMETHICONE 80 MG PO CHEW: 80.0000 mg | CHEWABLE_TABLET | ORAL | Status: DC | PRN

## 2021-06-19 MED ORDER — METHYLERGONOVINE MALEATE 0.2 MG PO TABS: 0.2000 mg | ORAL_TABLET | ORAL | Status: DC | PRN

## 2021-06-19 MED ORDER — PIPERACILLIN-TAZOBACTAM 3.375 G IVPB
3.3750 g | Freq: Three times a day (TID) | INTRAVENOUS | Status: DC
  Administered 2021-06-19 – 2021-06-23 (×12): 3.375 g via INTRAVENOUS
  Filled 2021-06-19 (×19): qty 50

## 2021-06-19 MED ORDER — COCONUT OIL OIL: 1.0000 "application " | TOPICAL_OIL | Status: DC | PRN

## 2021-06-19 MED ORDER — SENNOSIDES-DOCUSATE SODIUM 8.6-50 MG PO TABS
2.0000 | ORAL_TABLET | Freq: Every day | ORAL | Status: DC
  Administered 2021-06-20 – 2021-06-23 (×2): 2 via ORAL
  Filled 2021-06-19 (×2): qty 2

## 2021-06-19 MED ORDER — SODIUM CHLORIDE 0.9 % IV SOLN: 250.0000 mL | INTRAVENOUS | Status: DC | PRN

## 2021-06-19 MED ORDER — ONDANSETRON HCL 4 MG/2ML IJ SOLN: 4.0000 mg | INTRAMUSCULAR | Status: DC | PRN

## 2021-06-19 MED ORDER — PRENATAL MULTIVITAMIN CH
1.0000 | ORAL_TABLET | Freq: Every day | ORAL | Status: DC
  Administered 2021-06-20 – 2021-06-23 (×3): 1 via ORAL
  Filled 2021-06-19 (×3): qty 1

## 2021-06-19 MED ORDER — FERROUS SULFATE 325 (65 FE) MG PO TABS
325.0000 mg | ORAL_TABLET | Freq: Two times a day (BID) | ORAL | Status: DC
  Administered 2021-06-19 – 2021-06-23 (×8): 325 mg via ORAL
  Filled 2021-06-19 (×8): qty 1

## 2021-06-19 MED ORDER — SODIUM CHLORIDE 0.9% FLUSH
3.0000 mL | Freq: Two times a day (BID) | INTRAVENOUS | Status: DC
  Administered 2021-06-21: 3 mL via INTRAVENOUS

## 2021-06-19 MED ORDER — IBUPROFEN 800 MG PO TABS
800.0000 mg | ORAL_TABLET | Freq: Three times a day (TID) | ORAL | Status: DC
  Administered 2021-06-19 – 2021-06-23 (×11): 800 mg via ORAL
  Filled 2021-06-19 (×11): qty 1

## 2021-06-19 MED ORDER — OXYCODONE-ACETAMINOPHEN 5-325 MG PO TABS
2.0000 | ORAL_TABLET | ORAL | Status: DC | PRN
  Administered 2021-06-22 – 2021-06-23 (×4): 2 via ORAL
  Administered 2021-06-23: 1 via ORAL
  Filled 2021-06-19 (×5): qty 2

## 2021-06-19 MED ORDER — BENZOCAINE-MENTHOL 20-0.5 % EX AERO: 1.0000 "application " | INHALATION_SPRAY | CUTANEOUS | Status: DC | PRN

## 2021-06-19 MED ORDER — ZOLPIDEM TARTRATE 5 MG PO TABS: 5.0000 mg | ORAL_TABLET | Freq: Every evening | ORAL | Status: DC | PRN

## 2021-06-19 NOTE — Progress Notes (Signed)
Pt resting. ? ?Vitals:  ? 06/19/21 1718 06/19/21 1900  ?BP: 122/74 116/71  ?Pulse: 99   ?Resp: 18 18  ?Temp: 99.3 ?F (37.4 ?C) 98.4 ?F (36.9 ?C)  ?TempSrc: Oral Oral  ?SpO2: 99% 99%  ?  ?Lungs CTA ?Cor RRR ?Abdomen slightly tender ? ?Results for orders placed or performed during the hospital encounter of 06/19/21 (from the past 24 hour(s))  ?CBC     Status: Abnormal  ? Collection Time: 06/19/21  5:17 PM  ?Result Value Ref Range  ? WBC 20.0 (H) 4.0 - 10.5 K/uL  ? RBC 3.53 (L) 3.87 - 5.11 MIL/uL  ? Hemoglobin 10.4 (L) 12.0 - 15.0 g/dL  ? HCT 31.4 (L) 36.0 - 46.0 %  ? MCV 89.0 80.0 - 100.0 fL  ? MCH 29.5 26.0 - 34.0 pg  ? MCHC 33.1 30.0 - 36.0 g/dL  ? RDW 13.4 11.5 - 15.5 %  ? Platelets 536 (H) 150 - 400 K/uL  ? nRBC 0.0 0.0 - 0.2 %  ?Comprehensive metabolic panel     Status: Abnormal  ? Collection Time: 06/19/21  5:17 PM  ?Result Value Ref Range  ? Sodium 135 135 - 145 mmol/L  ? Potassium 4.2 3.5 - 5.1 mmol/L  ? Chloride 102 98 - 111 mmol/L  ? CO2 25 22 - 32 mmol/L  ? Glucose, Bld 93 70 - 99 mg/dL  ? BUN 21 (H) 6 - 20 mg/dL  ? Creatinine, Ser 1.10 (H) 0.44 - 1.00 mg/dL  ? Calcium 8.8 (L) 8.9 - 10.3 mg/dL  ? Total Protein 6.6 6.5 - 8.1 g/dL  ? Albumin 2.6 (L) 3.5 - 5.0 g/dL  ? AST 12 (L) 15 - 41 U/L  ? ALT 10 0 - 44 U/L  ? Alkaline Phosphatase 113 38 - 126 U/L  ? Total Bilirubin 0.3 0.3 - 1.2 mg/dL  ? GFR, Estimated >60 >60 mL/min  ? Anion gap 8 5 - 15  ?Resp Panel by RT-PCR (Flu A&B, Covid) Nasopharyngeal Swab     Status: None  ? Collection Time: 06/19/21  5:40 PM  ? Specimen: Nasopharyngeal Swab; Nasopharyngeal(NP) swabs in vial transport medium  ?Result Value Ref Range  ? SARS Coronavirus 2 by RT PCR NEGATIVE NEGATIVE  ? Influenza A by PCR NEGATIVE NEGATIVE  ? Influenza B by PCR NEGATIVE NEGATIVE  ?  ?A/P Pt on Zosyn and has had her MRI- awaiting results. Mild acute kidney injury- will start some fluids in case from dehydration.   ?
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
## 2021-06-20 NOTE — Progress Notes (Signed)
Patient seen and examined this AM.  She is feeling much better.  No longer feverish.  No abdominal pain at this time.  ? ?BP 107/61 (BP Location: Left Arm)   Pulse 74   Temp 97.8 ?F (36.6 ?C) (Oral)   Resp 18   SpO2 100%  ?NAD ?Remainder of exam deferred as pumping at this time.   ? ?Spoke with radiology re: MRI.  There is a cesarean section defect noted.  Fluid seems to extend from the endometrium through the defect to the anterior uterus.   Although she was supposed to receive contrast, it appears that it may have extravasated so unable to say if abscess vs liquefied hematoma.   Some endometrial debris in the uterus with increased signal on T1 most consistent with blood / hematoma.   ? ?Will return for post-contrast imaging to better evaluate if intra-abdominal fluid collection is abscess or hematoma.  Given her significant improvement in symptoms with just one dose of IV antibiotics, I suspect more likely a hematoma as I would not expect such a quick improvement if a pelvic abscess.  ?

## 2021-06-21 ENCOUNTER — Inpatient Hospital Stay (HOSPITAL_COMMUNITY): Payer: Commercial Managed Care - PPO

## 2021-06-21 LAB — APTT: aPTT: 37 seconds — ABNORMAL HIGH (ref 24–36)

## 2021-06-21 LAB — CBC WITH DIFFERENTIAL/PLATELET
Abs Immature Granulocytes: 0.37 10*3/uL — ABNORMAL HIGH (ref 0.00–0.07)
Basophils Absolute: 0.1 10*3/uL (ref 0.0–0.1)
Basophils Relative: 0 %
Eosinophils Absolute: 0.4 10*3/uL (ref 0.0–0.5)
Eosinophils Relative: 2 %
HCT: 33.7 % — ABNORMAL LOW (ref 36.0–46.0)
Hemoglobin: 10.7 g/dL — ABNORMAL LOW (ref 12.0–15.0)
Immature Granulocytes: 2 %
Lymphocytes Relative: 12 %
Lymphs Abs: 2.1 10*3/uL (ref 0.7–4.0)
MCH: 28.5 pg (ref 26.0–34.0)
MCHC: 31.8 g/dL (ref 30.0–36.0)
MCV: 89.6 fL (ref 80.0–100.0)
Monocytes Absolute: 1.2 10*3/uL — ABNORMAL HIGH (ref 0.1–1.0)
Monocytes Relative: 7 %
Neutro Abs: 13.8 10*3/uL — ABNORMAL HIGH (ref 1.7–7.7)
Neutrophils Relative %: 77 %
Platelets: 628 10*3/uL — ABNORMAL HIGH (ref 150–400)
RBC: 3.76 MIL/uL — ABNORMAL LOW (ref 3.87–5.11)
RDW: 13.5 % (ref 11.5–15.5)
WBC: 18 10*3/uL — ABNORMAL HIGH (ref 4.0–10.5)
nRBC: 0 % (ref 0.0–0.2)

## 2021-06-21 LAB — PROTIME-INR
INR: 1.1 (ref 0.8–1.2)
Prothrombin Time: 14.3 seconds (ref 11.4–15.2)

## 2021-06-21 MED ORDER — IOHEXOL 300 MG/ML  SOLN
100.0000 mL | Freq: Once | INTRAMUSCULAR | Status: AC | PRN
  Administered 2021-06-21: 100 mL via INTRAVENOUS
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
## 2021-06-21 NOTE — Progress Notes (Signed)
Sameerah Nachtigal 31 y.o. [id number] PPD# 16 s/p VBAC, admitted with postpartum fever, found to have pelvic abscess ? ?S: ?She is feeling okay this morning. Endorses some RLQ pain. No fever.  ? ?O: ?Vitals:  ? 06/20/21 2031 06/21/21 0039 06/21/21 0308 06/21/21 0843  ?BP: (!) 112/58 (!) 109/54 119/79 107/68  ?Pulse: 82 69 84 69  ?Resp: 16 15 16    ?Temp: (!) 97.5 ?F (36.4 ?C) 98.2 ?F (36.8 ?C) 97.8 ?F (36.6 ?C) 98.3 ?F (36.8 ?C)  ?TempSrc: Oral Oral Oral Oral  ?SpO2: 99% 94% 98% 100%  ? ?Gen: no acute distress, sitting up in bed ?CVS: normal pulses ?Lungs: nonlabored respirations ?Abd: soft, minimal tenderness to palpation RLQ and suprapubic. No rebound or guarding ?Ext; no calf edema or tenderness ? ?MRI WITH CONTRAST ? ?ADDENDUM: ?Repeat pre and post contrast images were performed. ?  ?The fluid collection along the anterior wall of the uterus ?demonstrates moderate rim enhancement consistent with an abscess. ?This measures a maximum of 7.6 cm. There is also a 3 cm rim ?enhancing fluid collection in the right lower quadrant consistent ?with a second abscess. ? ?A/p: ?Konstance Happel 31 y.o. [id number] PPD#16 s/p VBAC  ?- Patient has been improving on IV zosyn and has been afebrile > 24 hours.  ?- MRI repeated with IV contrast last night, radiology read this morning notes two abscesses: 7.6 cm anterior uterus (does not appear connected to cesarean defect) and 3cm RLQ.  ?- I spoke with IR this AM, who reviewed MRI and requested CT pelvis with IV contrast for determining if these abscesses are amenable to drainage. Also requested to make patient NPO and collect coags. They will review CT scan and let [id number] know if IR drainage is possible. ? ? ?Plan of care reviewed with patient and her husband. [place], MD 06/21/21 12:02 PM  ?

## 2021-06-21 NOTE — Progress Notes (Signed)
Radiologist called:  ?Abscess looks like horseshoe wrapping around front of uterus behind bladder.  He also sees a 3 cm abscess in the RLQ.  Full report to follow. ?Will d/w doctor on call- should consult interventional radiology to see if they can drain either site.  ?

## 2021-06-22 ENCOUNTER — Encounter (HOSPITAL_COMMUNITY): Payer: Self-pay | Admitting: Obstetrics and Gynecology

## 2021-06-22 ENCOUNTER — Inpatient Hospital Stay (HOSPITAL_COMMUNITY): Payer: Commercial Managed Care - PPO

## 2021-06-22 MED ORDER — FENTANYL CITRATE (PF) 100 MCG/2ML IJ SOLN
INTRAMUSCULAR | Status: AC
  Filled 2021-06-22: qty 4

## 2021-06-22 MED ORDER — SODIUM CHLORIDE 0.9% FLUSH
5.0000 mL | Freq: Three times a day (TID) | INTRAVENOUS | Status: DC
  Administered 2021-06-22 – 2021-06-23 (×3): 5 mL

## 2021-06-22 MED ORDER — FENTANYL CITRATE (PF) 100 MCG/2ML IJ SOLN
INTRAMUSCULAR | Status: AC | PRN
  Administered 2021-06-22 (×2): 50 ug via INTRAVENOUS

## 2021-06-22 MED ORDER — MIDAZOLAM HCL 2 MG/2ML IJ SOLN
INTRAMUSCULAR | Status: AC | PRN
  Administered 2021-06-22 (×2): 1 mg via INTRAVENOUS

## 2021-06-22 MED ORDER — MIDAZOLAM HCL 2 MG/2ML IJ SOLN
INTRAMUSCULAR | Status: AC
  Filled 2021-06-22: qty 4

## 2021-06-22 MED ORDER — LIDOCAINE HCL 1 % IJ SOLN
INTRAMUSCULAR | Status: AC
Start: 2021-06-22 — End: 2021-06-22
  Filled 2021-06-22: qty 10

## 2021-06-22 NOTE — Procedures (Signed)
Interventional Radiology Procedure Note ? ?Procedure: CT guided pelvic fluid collection drain placement ? ?Findings: Please refer to procedural dictation for full description. 10.2 Fr pigtail drain placed in pelvic fluid collection.  Approximately 5 mL sanguinous output, sample sent for culture.  Pigtail portion of drain extends partially into anterior uterine scar which is in apparent communication with fluid collection, totally resolved on completion imaging. ? ?Complications: None immediate ? ?Estimated Blood Loss: < 5 mL ? ?Recommendations: ?Keep to bulb suction for now. ?If drain output < 10 mL/24 hrs, have low threshold to remove as collection almost totally resolved after procedure and due to encroachment of drain on healing uterine scar. ? ?Marliss Coots, MD ?Pager: [phone] ? ? ? ?

## 2021-06-22 NOTE — Progress Notes (Signed)
Postpartum Day #17 from successful VBAC. S/P IR drain placement for  Pelvic fluid/abscess collection. Patient is eating, ambulating, voiding.  Pain control is good.  Denies fever or abd tenderness. ? ?Vitals:  ? 06/22/21 1401 06/22/21 1406 06/22/21 1638 06/22/21 1641  ?BP: 112/72  121/60   ?Pulse: 96  87   ?Resp:   20   ?Temp:   98.6 ?F (37 ?C)   ?TempSrc:   Oral   ?SpO2: 99% 99% 99%   ?Weight:    81.9 kg  ?Height:    5\' 8"  (1.727 m)  ? ?Gen: NAD ?Abd: nontender ?Drain: 20-25cc serosanguinous fluid in bulb ? ? ?Lab Results  ?Component Value Date  ? WBC 18.0 (H) 06/21/2021  ? HGB 10.7 (L) 06/21/2021  ? HCT 33.7 (L) 06/21/2021  ? MCV 89.6 06/21/2021  ? PLT 628 (H) 06/21/2021  ? ? ?--/--/O POS (02/28 1920) ? ?A/P Post partum day 17. Post op day #1 IR drain. ?Cont. Zosyn, will monitor drainage and remove as IR directs ?Once stable for d/c will plan to send home with 1 week of Augmentin. ? Other routine care.  ? ?09-12-2000 ?  ?

## 2021-06-22 NOTE — Progress Notes (Signed)
Spoke with IR Dr. Elby Showers re: today's procedure.  Will plan for drain removal tomorrow if output low as expected.  If patient continues to do well and remains afebrile will plan to d/c home on 1 week of Augmentin. ?

## 2021-06-23 MED ORDER — AMOXICILLIN-POT CLAVULANATE 500-125 MG PO TABS
1.0000 | ORAL_TABLET | Freq: Three times a day (TID) | ORAL | Status: DC
  Filled 2021-06-23: qty 1

## 2021-06-23 MED ORDER — OXYCODONE-ACETAMINOPHEN 5-325 MG PO TABS: 2.0000 | ORAL_TABLET | ORAL | 0 refills | Status: AC | PRN

## 2021-06-23 MED ORDER — AMOXICILLIN-POT CLAVULANATE 500-125 MG PO TABS: 500.0000 mg | ORAL_TABLET | Freq: Three times a day (TID) | ORAL | 0 refills | Status: AC

## 2021-06-23 NOTE — Discharge Summary (Signed)
? ? Discharge Summary ?Patient was admitted postpartum day #14 from uncomplicated VBAC for persistent fevers. Pt called office on 3-6 about having low grade fever and was given Clindamycin PO.  Pt called again on 3-10 at complaining of fever to 101 even with tylenol and ibuprofen and pt was feeling really bad.  She had chills and was laying in bed with abdominal pain.  She was instructed to go to [facility], physician called ahead for them to evaluate her, expecting her to be admitted.  However, she was treated for a UTI and never had any imaging of her uterus. She was changed to bactrim and discharged to home.  ?Pt called again 3/15 with continued fever and not feeling well.  She came to office and had US showing fluid collection extending into the previous uterine scar. She was admitted and underwent MRI and was started on Zosyn.  MRI showed possible abscess vs hematoma with involvement of uterine scar. IR was consulted and requested CT for possible IR drainage.  Drainage performed 3/18, see note. Collection totally resolved on completion of imaging.  Drain remained in place with serosanguinous output and clot c/w hematoma, no pus noted.  Sample sent for culture returned no growth.  As of 3/19, drain output low and IR recommended removal.  Had previously discussed sending pt home with 1 week Augmentin.  This was sent with addition oxycodone #10 tablets.  Pt instructed to call office for f/u visit.  Patient discharge home in stable condition on HD#5 ?   ? ?Vitals:  ? 06/23/21 0355 06/23/21 0849 06/23/21 1200 06/23/21 1609  ?BP: 99/66 107/65 104/60 (!) 97/51  ?Pulse: (!) 58 68 72 68  ?Resp: 16 17 19 18   ?Temp: 97.9 ?F (36.6 ?C) 98 ?F (36.7 ?C) 98.1 ?F (36.7 ?C) 97.7 ?F (36.5 ?C)  ?TempSrc: Oral Oral Oral Oral  ?SpO2: 99% 100% 97% 97%  ?Weight:      ?Height:      ? ?General: alert, cooperative, and no distress ?Abd: soft, nontender ?Incision: IR dressing is clean, dry, and intact ?DVT Evaluation: No evidence of DVT seen on  physical exam. ?Labs: ?Lab Results  ?Component Value Date  ? WBC 18.0 (H) 06/21/2021  ? HGB 10.7 (L) 06/21/2021  ? HCT 33.7 (L) 06/21/2021  ? MCV 89.6 06/21/2021  ? PLT 628 (H) 06/21/2021  ? ?CMP Latest Ref Rng & Units 06/19/2021  ?Glucose 70 - 99 mg/dL 93  ?BUN 6 - 20 mg/dL 06/21/2021)  ?Creatinine 0.44 - 1.00 mg/dL 42(P)  ?Sodium 135 - 145 mmol/L 135  ?Potassium 3.5 - 5.1 mmol/L 4.2  ?Chloride 98 - 111 mmol/L 102  ?CO2 22 - 32 mmol/L 25  ?Calcium 8.9 - 10.3 mg/dL 5.36(R)  ?Total Protein 6.5 - 8.1 g/dL 6.6  ?Total Bilirubin 0.3 - 1.2 mg/dL 0.3  ?Alkaline Phos 38 - 126 U/L 113  ?AST 15 - 41 U/L 12(L)  ?ALT 0 - 44 U/L 10  ? ? ? ?After visit meds:  ?Allergies as of 06/23/2021   ? ?   Reactions  ? Penicillins Nausea Only  ? Penicillins Nausea Only  ? "it upset my stomach, I think i didn't eat much before taking it."  ? ?  ? ?  ?Medication List  ?  ? ?STOP taking these medications   ? ?sulfamethoxazole-trimethoprim 800-160 MG tablet ?Commonly known as: BACTRIM DS ?  ? ?  ? ?TAKE these medications   ? ?amoxicillin-clavulanate 500-125 MG tablet ?Commonly known as: AUGMENTIN ?Take 1  tablet (500 mg total) by mouth 3 (three) times daily for 7 days. ?  ?cetirizine 10 MG tablet ?Commonly known as: ZYRTEC ?Take 10 mg by mouth daily. ?  ?docusate sodium 100 MG capsule ?Commonly known as: COLACE ?Take 100 mg by mouth 2 (two) times daily. ?  ?fexofenadine-pseudoephedrine 180-240 MG 24 hr tablet ?Commonly known as: ALLEGRA-D 24 ?Take 1 tablet by mouth daily. ?  ?ibuprofen 600 MG tablet ?Commonly known as: ADVIL ?Take 1 tablet (600 mg total) by mouth every 6 (six) hours as needed for moderate pain or cramping. ?  ?oxyCODONE-acetaminophen 5-325 MG tablet ?Commonly known as: PERCOCET/ROXICET ?Take 2 tablets by mouth every 4 (four) hours as needed (pain scale > 7). ?  ?prenatal multivitamin Tabs tablet ?Take 1 tablet by mouth daily at 12 noon. ?  ? ?  ? ? ? ? ? Follow-up Information   ? ? Ob/Gyn, Nestor Ramp. Call.   ?Why: call to schedule  f/u appt ?Contact information: ?[address] ?[address] ?[place] [address] ?[phone] ? ? ?  ?  ? ?  ?  ? ?  ? ? ? ?  ? ?06/23/2021 ?Philip Aspen, DO ? ? ?

## 2021-06-23 NOTE — Progress Notes (Signed)
? ? ?Chief Complaint: ?Patient was seen today for pelvic drain follow up ? ?Supervising Physician: Ruthann Cancer ? ?Patient Status: [facility] - In-pt ? ?Subjective: ?S/p perc drain to pelvic fluid collection. ?Concerning for abscess but essentially just old blood coming out of drain, favors hematoma ? ?Objective: ?Physical Exam: ?BP 107/65 (BP Location: Left Arm)   Pulse 68   Temp 98 ?F (36.7 ?C) (Oral)   Resp 17   Ht 5\' 8"  (1.727 m)   Wt 81.9 kg   SpO2 100%   BMI 27.45 kg/m?  ?Drain intact, site clean. ?Output dark sanguinous fluid with some stranding clot. ? ? ?Current Facility-Administered Medications:  ?  sodium chloride flush (NS) 0.9 % injection 3 mL, 3 mL, Intravenous, Q12H, 3 mL at 06/21/21 1842 **AND** sodium chloride flush (NS) 0.9 % injection 3 mL, 3 mL, Intravenous, PRN **AND** 0.9 %  sodium chloride infusion, 250 mL, Intravenous, PRN, Bobbye Charleston, MD ?  acetaminophen (TYLENOL) tablet 650 mg, 650 mg, Oral, Q4H PRN, Bobbye Charleston, MD, 650 mg at 06/21/21 2307 ?  benzocaine-Menthol (DERMOPLAST) 20-0.5 % topical spray 1 application., 1 application., Topical, PRN, Bobbye Charleston, MD ?  coconut oil, 1 application., Topical, PRN, Bobbye Charleston, MD ?  witch hazel-glycerin (TUCKS) pad 1 application., 1 application., Topical, PRN **AND** dibucaine (NUPERCAINAL) 1 % rectal ointment 1 application., 1 application., Rectal, PRN, Bobbye Charleston, MD ?  diphenhydrAMINE (BENADRYL) capsule 25 mg, 25 mg, Oral, Q6H PRN, Bobbye Charleston, MD ?  ferrous sulfate tablet 325 mg, 325 mg, Oral, BID WC, Bobbye Charleston, MD, 325 mg at 06/23/21 0849 ?  ibuprofen (ADVIL) tablet 800 mg, 800 mg, Oral, Q8H, Bobbye Charleston, MD, 800 mg at 06/23/21 0355 ?  lactated ringers infusion, , Intravenous, Continuous, Bobbye Charleston, MD, Last Rate: 100 mL/hr at 06/23/21 0359, New Bag at 06/23/21 0359 ?  loratadine (CLARITIN) tablet 10 mg, 10 mg, Oral, Daily, Bobbye Charleston, MD, 10 mg at 06/21/21 1245 ?  magnesium  hydroxide (MILK OF MAGNESIA) suspension 30 mL, 30 mL, Oral, Q3 days PRN, Bobbye Charleston, MD ?  methylergonovine (METHERGINE) tablet 0.2 mg, 0.2 mg, Oral, Q4H PRN **OR** methylergonovine (METHERGINE) injection 0.2 mg, 0.2 mg, Intramuscular, Q4H PRN, Bobbye Charleston, MD ?  ondansetron (ZOFRAN) tablet 4 mg, 4 mg, Oral, Q4H PRN **OR** ondansetron (ZOFRAN) injection 4 mg, 4 mg, Intravenous, Q4H PRN, Bobbye Charleston, MD ?  oxyCODONE-acetaminophen (PERCOCET/ROXICET) 5-325 MG per tablet 2 tablet, 2 tablet, Oral, Q4H PRN, Bobbye Charleston, MD, 2 tablet at 06/23/21 0355 ?  piperacillin-tazobactam (ZOSYN) IVPB 3.375 g, 3.375 g, Intravenous, Q8H, Bobbye Charleston, MD, Last Rate: 12.5 mL/hr at 06/23/21 0407, 3.375 g at 06/23/21 0407 ?  prenatal multivitamin tablet 1 tablet, 1 tablet, Oral, Q1200, Bobbye Charleston, MD, 1 tablet at 06/22/21 1418 ?  senna-docusate (Senokot-S) tablet 2 tablet, 2 tablet, Oral, Daily, Bobbye Charleston, MD, 2 tablet at 06/20/21 0920 ?  simethicone (MYLICON) chewable tablet 80 mg, 80 mg, Oral, PRN, Bobbye Charleston, MD ?  sodium chloride flush (NS) 0.9 % injection 5 mL, 5 mL, Intracatheter, Q8H, Suttle, Dylan J, MD, 5 mL at 06/23/21 0356 ?  zolpidem (AMBIEN) tablet 5 mg, 5 mg, Oral, QHS PRN, Bobbye Charleston, MD ? ?Labs: ?CBC ?Recent Labs  ?  06/21/21 ?1022  ?WBC 18.0*  ?HGB 10.7*  ?HCT 33.7*  ?PLT 628*  ? ?BMET ?No results for input(s): NA, K, CL, CO2, GLUCOSE, BUN, CREATININE, CALCIUM in the last 72 hours. ?LFT ?No results for input(s): PROT, ALBUMIN, AST, ALT, ALKPHOS, BILITOT, BILIDIR,  IBILI, LIPASE in the last 72 hours. ?PT/INR ?Recent Labs  ?  06/21/21 ?1022  ?LABPROT 14.3  ?INR 1.1  ? ? ? ?Studies/Results: ?CT PELVIS W CONTRAST ? ?Result Date: 06/21/2021 ?CLINICAL DATA:  Intra-abdominal abscess EXAM: CT PELVIS WITH CONTRAST TECHNIQUE: Multidetector CT imaging of the pelvis was performed using the standard protocol following the bolus administration of intravenous contrast. RADIATION  DOSE REDUCTION: This exam was performed according to the departmental dose-optimization program which includes automated exposure control, adjustment of the mA and/or kV according to patient size and/or use of iterative reconstruction technique. CONTRAST:  152mL OMNIPAQUE IOHEXOL 300 MG/ML  SOLN COMPARISON:  MRI pelvis 06/19/2021 FINDINGS: Urinary Tract: Visualized portions of the ureters are nondilated. The bladder is not well evaluated due to under distension. Bowel: No dilated loops of bowel identified within the visualized pelvis. Vascular/Lymphatic: No pathologically enlarged lymph nodes. No significant vascular abnormality seen. Reproductive: Anterior uterine defect consistent with recent C-section. Small amount of fluid is present within the endometrium. There are 2 small hypodense collections in the right lower quadrant measuring 2.8 x 2.0 x 2.2 cm and 1.6 x 1.5 x 1.3 cm. These may be located within or adjacent to the right ovary. U shaped fluid collection again seen interposed between the uterus and bladder. It is difficult to measure due to its irregular shape but the approximate size is 6.9 x 1.9 x 2.2 cm. Other:  None. Musculoskeletal: No suspicious bone lesions identified. IMPRESSION: 1. U shaped fluid collection again seen interposed between the uterus and bladder measuring approximately 6.9 x 1.9 x 2.2 cm. This collection has enhancing rim and is suspicious for abscess. 2. Two small hypodense collections in the right lower quadrant measuring 2.8 x 2.0 x 2.2 cm and 1.6 x 1.5 x 1.3 cm may be abscesses within or adjacent to the right ovary. Electronically Signed   By: Miachel Roux M.D.   On: 06/21/2021 12:56  ? ?CT IMAGE GUIDED DRAINAGE BY PERCUTANEOUS CATHETER ? ?Result Date: 06/22/2021 ?INDICATION: 31 year old female with history of recent vaginal birth after cesarean section complicated by pelvic fluid collection concerning for abscess. EXAM: CT IMAGE GUIDED DRAINAGE BY PERCUTANEOUS CATHETER  COMPARISON:  06/19/2021, 06/21/2021 MEDICATIONS: The patient is currently admitted to the hospital and receiving intravenous antibiotics. The antibiotics were administered within an appropriate time frame prior to the initiation of the procedure. ANESTHESIA/SEDATION: Moderate (conscious) sedation was employed during this procedure. A total of Versed 2 mg and Fentanyl 100 mcg was administered intravenously. Moderate Sedation Time: 17 minutes. The patient's level of consciousness and vital signs were monitored continuously by radiology nursing throughout the procedure under my direct supervision. CONTRAST:  None COMPLICATIONS: None immediate. PROCEDURE: RADIATION DOSE REDUCTION: This exam was performed according to the departmental dose-optimization program which includes automated exposure control, adjustment of the mA and/or kV according to patient size and/or use of iterative reconstruction technique. Informed written consent was obtained from the patient after a discussion of the risks, benefits and alternatives to treatment. The patient was placed supine on the CT gantry and a pre procedural CT was performed re-demonstrating the known abscess/fluid collection within the vesicouterine pouch. The procedure was planned. A timeout was performed prior to the initiation of the procedure. The right lower pelvis was prepped and draped in the usual sterile fashion. The overlying soft tissues were anesthetized with 1% lidocaine with epinephrine. Appropriate trajectory was planned with the use of a 22 gauge spinal needle. An 18 gauge trocar needle was advanced into the abscess/fluid  collection and a short Amplatz super stiff wire was coiled within the collection. Appropriate positioning was confirmed with a limited CT scan. The tract was serially dilated allowing placement of a 10.2 [place] all-purpose drainage catheter. Appropriate positioning was confirmed with a limited postprocedural CT scan. Approximately 5 ml of  sanguinous fluid was aspirated. The tube was connected to a bulb suction and sutured in place. A dressing was placed. The patient tolerated the procedure well without immediate post procedural complication. IMPRESSION: [employer]

## 2021-06-23 NOTE — Plan of Care (Signed)
  Problem: Education: Goal: Knowledge of General Education information will improve Description: Including pain rating scale, medication(s)/side effects and non-pharmacologic comfort measures Outcome: Adequate for Discharge   

## 2021-06-23 NOTE — Progress Notes (Signed)
Patient reports feeling better that this morning when she felt a little dizzy due to taking 2 oxycodone.  Pain well controlled.  Spoke with Dr. Elby Showers IR and reviewed path and total drainage.  He advised ok to remove drain and ok to d/c home.  IR drain removed without difficulty, gauze and tape placed over incision. Will send 1 week Augmentin and have pt call office to schedule f/u appt. ?

## 2021-06-23 NOTE — Progress Notes (Signed)
Patient feeling well, but more lightheaded when getting up, she thinks from the narcotic pain medication.  Otherwise doing well. ?Abd: nontender ?Drain site: discomfort, but not severe ?Drainage: 30cc since yesterday, serosang and clot, no obvious pus ?Path: abdundant WBCs with few gram+cocci, no growth ?A/P: ?Patient states this morning IR told her they plan to return after path result to possibly remove.  Will await their plan. ?

## 2021-06-27 LAB — AEROBIC/ANAEROBIC CULTURE W GRAM STAIN (SURGICAL/DEEP WOUND): Culture: NO GROWTH

## 2021-07-11 ENCOUNTER — Other Ambulatory Visit: Payer: Self-pay | Admitting: Obstetrics and Gynecology

## 2021-07-11 DIAGNOSIS — Z87898 Personal history of other specified conditions: Secondary | ICD-10-CM

## 2021-07-11 DIAGNOSIS — Z8619 Personal history of other infectious and parasitic diseases: Secondary | ICD-10-CM

## 2021-07-20 ENCOUNTER — Inpatient Hospital Stay: Admission: RE | Admit: 2021-07-20 | Payer: Commercial Managed Care - PPO | Source: Ambulatory Visit

## 2021-07-24 ENCOUNTER — Other Ambulatory Visit: Payer: Self-pay | Admitting: Obstetrics and Gynecology

## 2021-07-24 DIAGNOSIS — Z8619 Personal history of other infectious and parasitic diseases: Secondary | ICD-10-CM

## 2021-07-24 DIAGNOSIS — Z87898 Personal history of other specified conditions: Secondary | ICD-10-CM

## 2021-08-01 ENCOUNTER — Ambulatory Visit
Admission: RE | Admit: 2021-08-01 | Discharge: 2021-08-01 | Disposition: A | Discharge status: Home / Self Care | Payer: Commercial Managed Care - PPO | Source: Ambulatory Visit | Attending: Obstetrics and Gynecology | Admitting: Obstetrics and Gynecology

## 2021-08-01 DIAGNOSIS — Z8619 Personal history of other infectious and parasitic diseases: Secondary | ICD-10-CM

## 2021-08-01 DIAGNOSIS — Z87898 Personal history of other specified conditions: Secondary | ICD-10-CM

## 2021-08-01 MED ORDER — GADOBENATE DIMEGLUMINE 529 MG/ML IV SOLN
14.0000 mL | Freq: Once | INTRAVENOUS | Status: AC | PRN
  Administered 2021-08-01: 14 mL via INTRAVENOUS

## 2022-07-24 IMAGING — MR MR PELVIS WO/W CM
17 of 18 series · 45 of 48 positions shown · IV contrast (14 mL Multihance)
Comparison: CT pelvis 06/21/2021

CLINICAL DATA: Previous pelvic abscess, follow-up.  Pelvic pain

EXAM:
MRI PELVIS WITHOUT AND WITH CONTRAST
TECHNIQUE: Multiplanar multisequence MR imaging of the pelvis was performed
both before and after administration of intravenous contrast.
CONTRAST:  14mL MULTIHANCE GADOBENATE DIMEGLUMINE 529 MG/ML IV SOLN

[Series 2: T2 · coronal · 5.0mm · 1.25mm/px · 2 of 30 slices shown (1 of 4)]
[im 1/30]
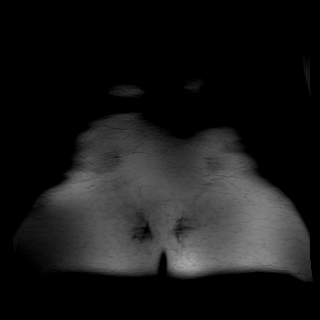
[im 30/30]
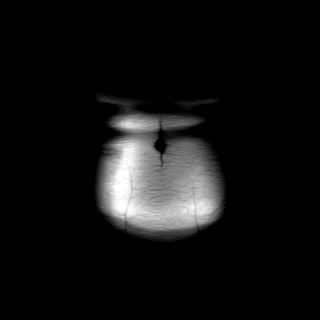

[Series 3: T2 · axial · 5.0mm · 0.41mm/px · 1 of 30 slices shown (2 of 4)]
[im 1/30]
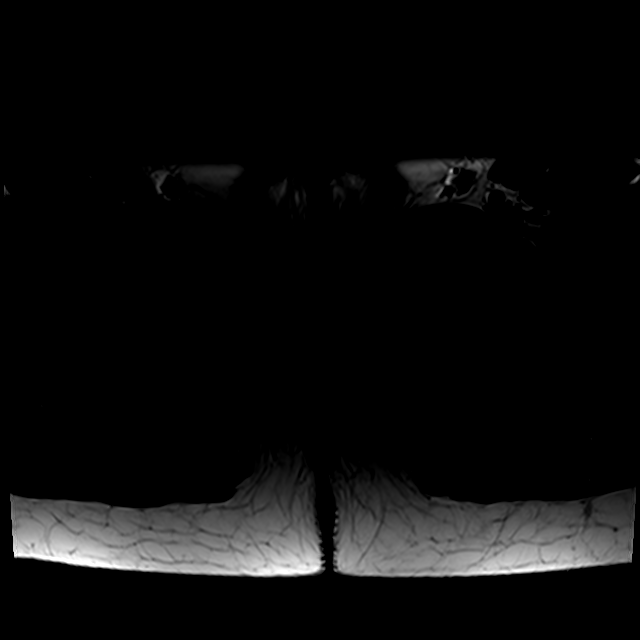

[Series 4: T2 fat-sat · axial · 5.0mm · 0.41mm/px · 1 of 30 slices shown]
[im 1/30]
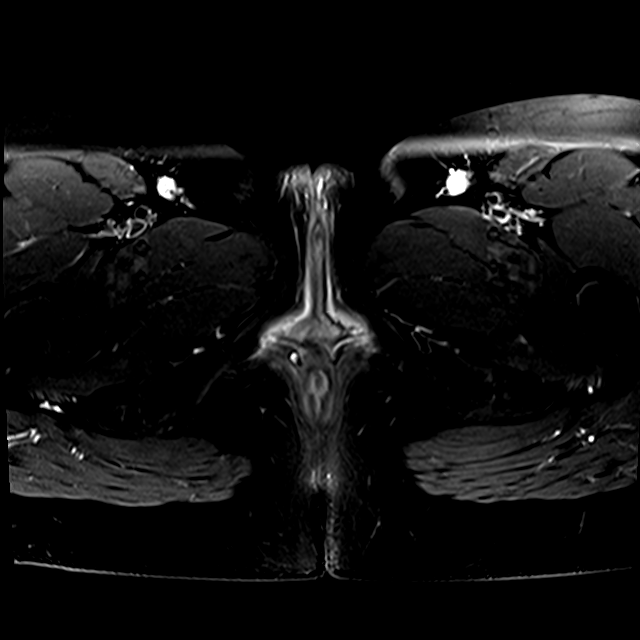

[Series 5: T2 · sagittal · 5.0mm · 0.78mm/px · 1 of 25 slices shown (3 of 4)]
[im 1/25]
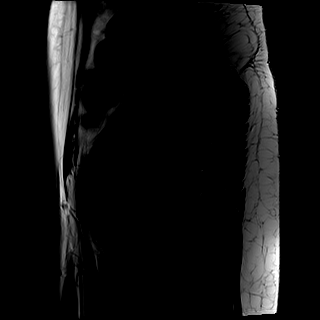

[Series 6: T2 · coronal · 5.0mm · 0.81mm/px · 1 of 27 slices shown (4 of 4)]
[im 1/27]
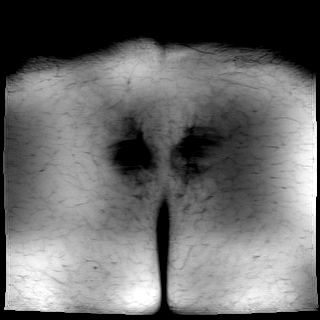

[Series 7: ax dwi_tracew_dfc · axial · 5.0mm · 1.19mm/px · z∈[-105,+105]mm · 4 of 108 slices shown]
[im 1/108]
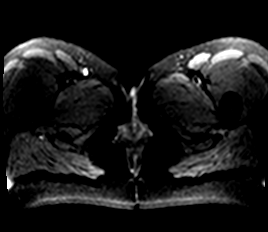
[im 36/108]
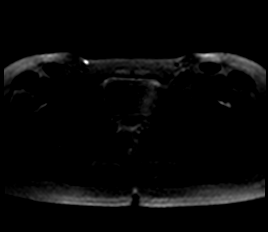
[im 72/108]
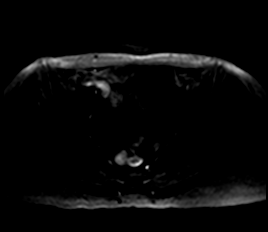
[im 108/108]
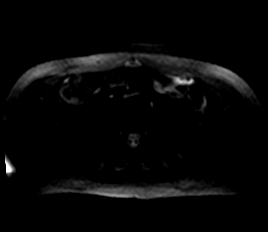

[Series 8: ax dwi_adc_dfc · axial · 5.0mm · 1.19mm/px · 1 of 36 slices shown]
[im 1/36]
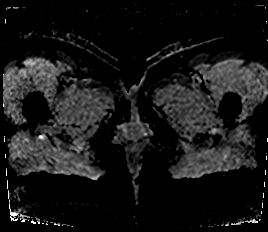

[Series 9: T1 · axial · 5.0mm · 0.74mm/px · z∈[-103,+101]mm · 3 of 70 slices shown]
[im 1/70]
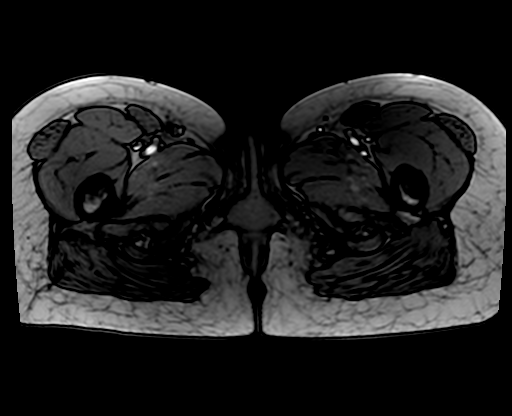
[im 35/70]
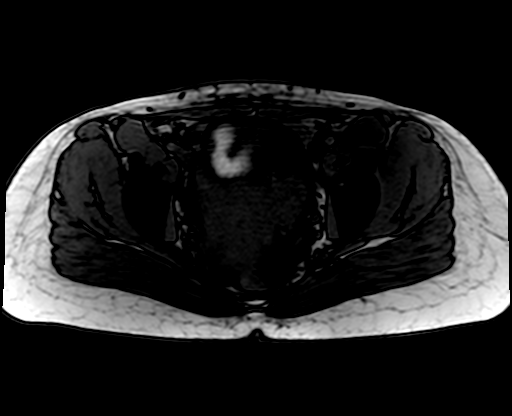
[im 70/70]
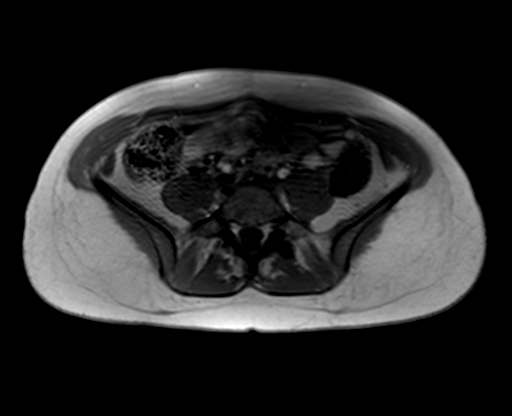

[Series 10: T1 dynamic · axial · non-contrast · 3.0mm · 1.25mm/px · z∈[-119,+118]mm · 3 of 80 slices shown]
[im 1/80]
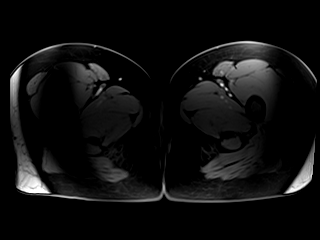
[im 40/80]
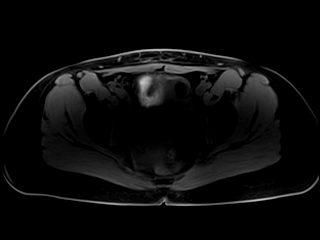
[im 80/80]
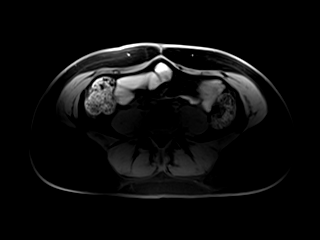

[Series 11: T1 dynamic post-contrast · axial · 3.0mm · 1.25mm/px · z∈[-119,+118]mm · 3 of 80 slices shown (1 of 6)]
[im 1/80]
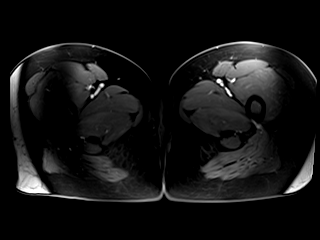
[im 40/80]
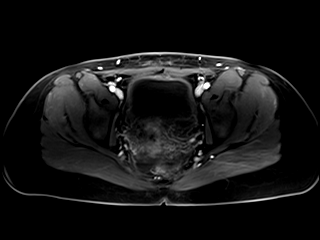
[im 80/80]
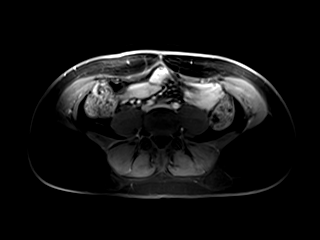

[Series 12: T1 dynamic post-contrast · axial · 3.0mm · 1.25mm/px · z∈[-119,+118]mm · 3 of 80 slices shown (2 of 6)]
[im 1/80]
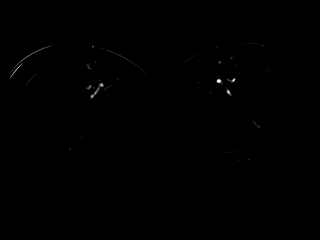
[im 40/80]
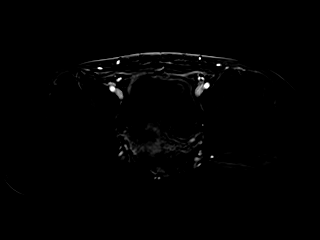
[im 80/80]
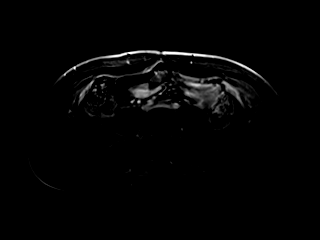

[Series 13: T1 dynamic post-contrast · axial · 3.0mm · 1.25mm/px · z∈[-119,+118]mm · 3 of 80 slices shown (3 of 6)]
[im 1/80]
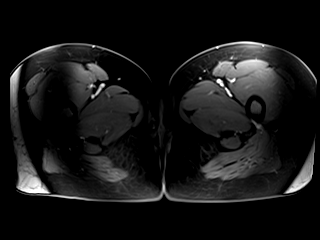
[im 40/80]
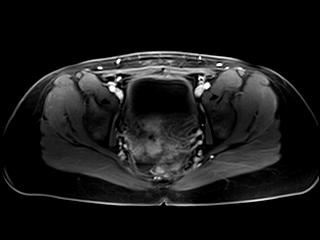
[im 80/80]
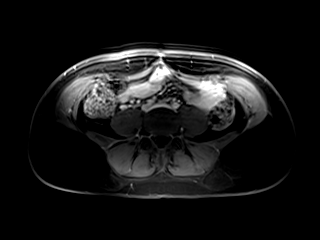

[Series 14: T1 dynamic post-contrast · axial · 3.0mm · 1.25mm/px · z∈[-119,+118]mm · 3 of 80 slices shown (4 of 6)]
[im 1/80]
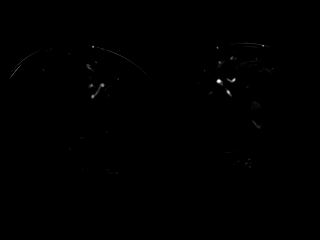
[im 40/80]
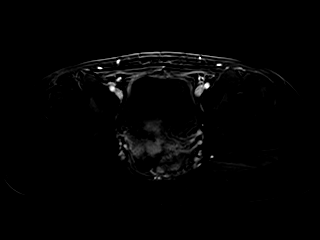
[im 80/80]
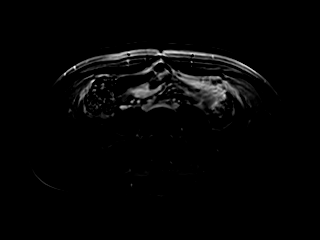

[Series 15: T1 dynamic post-contrast · axial · 3.0mm · 1.25mm/px · z∈[-119,+118]mm · 3 of 80 slices shown (5 of 6)]
[im 1/80]
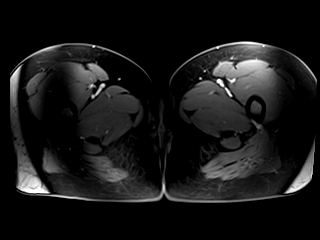
[im 40/80]
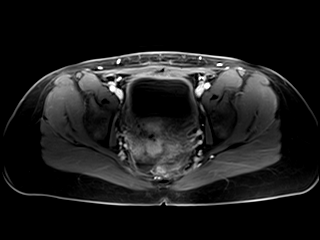
[im 80/80]
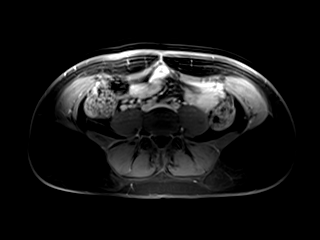

[Series 16: T1 dynamic post-contrast · axial · 3.0mm · 1.25mm/px · z∈[-119,+118]mm · 3 of 80 slices shown (6 of 6)]
[im 1/80]
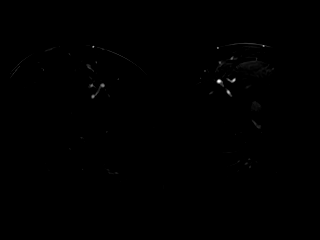
[im 40/80]
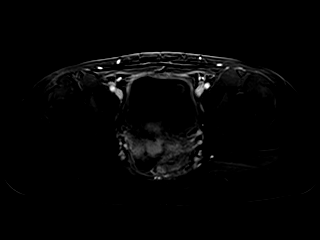
[im 80/80]
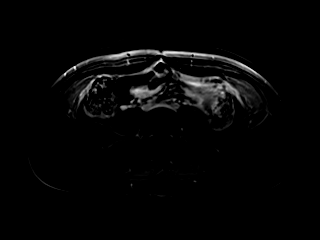

[Series 17: T1 fat-sat post-contrast · axial · 1.2mm · 0.75mm/px · z∈[-88,+84]mm · 6 of 144 slices shown]
[im 1/144]
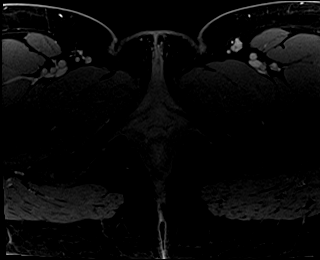
[im 29/144]
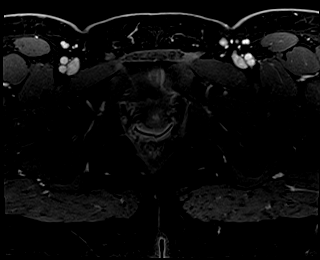
[im 58/144]
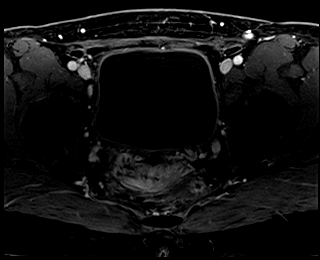
[im 86/144]
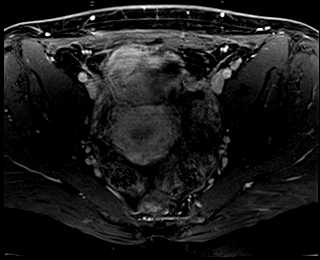
[im 115/144]
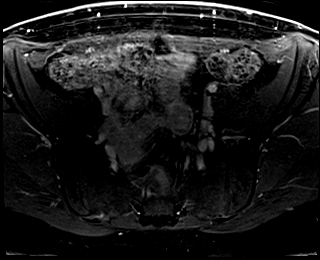
[im 144/144]
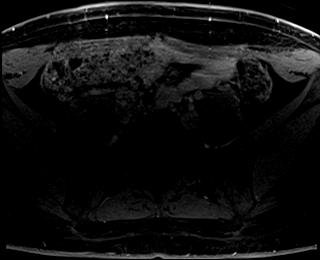

[Series 18: T1 fat-sat · sagittal · 1.2mm · 0.88mm/px · 4 of 144 slices shown]
[im 1/144]
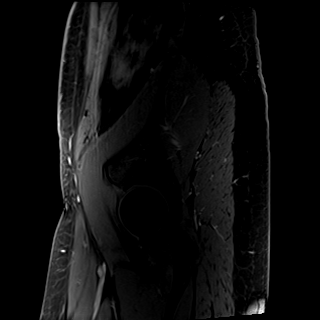
[im 29/144]
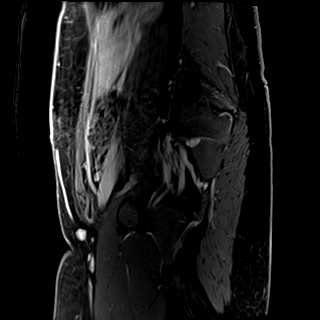
[im 58/144]
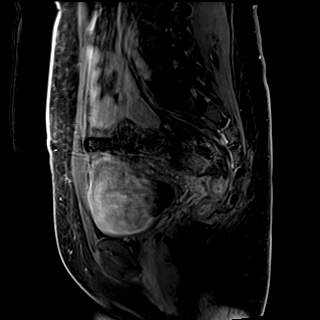
[im 86/144]
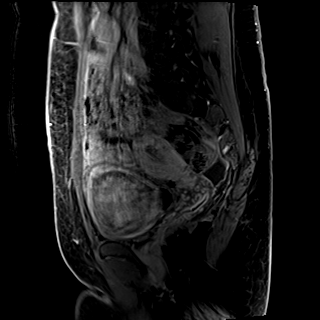

[45 of 48 positions shown; findings below may reference images not displayed]

FINDINGS: Urinary Tract:  Urinary bladder and urethra appear normal.

Bowel:  No evidence of bowel obstruction.

Vascular/Lymphatic: No pathologically enlarged lymph nodes. No
significant vascular abnormality seen.

Reproductive: Uterus measures 3.2 x 4.6 x 8.5 cm. No suspicious
myometrial mass identified. Previous cesarean section surgical
changes at the anterior lower uterine segment. Cervix appears
normal. Vaginal canal appears normal. Ovaries appear normal size
with small follicles. No suspicious adnexal mass identified.

Other: Small amount of free fluid in the posterior cul-de-sac, most
likely physiologic. Interval resolution of the previous rim
enhancing fluid collection centered in the vesico uterine space.

Musculoskeletal: No suspicious bone lesions identified.
IMPRESSION: 1. Interval resolution of the previous rim enhancing fluid
collection/abscess in the pelvis.
2. No acute process or suspicious mass identified.

## 2023-11-03 ENCOUNTER — Telehealth: Payer: Self-pay

## 2023-11-03 NOTE — Telephone Encounter (Signed)
 RN received call on nurse voicemail line to return patient call regarding birth control question. RN attempted to return patent call, left HIPAA compliant voicemail to return RN call.  Silvano LELON Piano, RN
